# Patient Record
Sex: Female | Born: 1960 | Race: White | Hispanic: No | Smoking: Former smoker
Health system: Southern US, Community
[De-identification: ages and names within clinical notes are randomized; demographics above are authoritative.]

## PROBLEM LIST (undated history)

## (undated) DIAGNOSIS — F419 Anxiety disorder, unspecified: Secondary | ICD-10-CM

## (undated) DIAGNOSIS — D649 Anemia, unspecified: Secondary | ICD-10-CM

## (undated) DIAGNOSIS — I4891 Unspecified atrial fibrillation: Secondary | ICD-10-CM

## (undated) DIAGNOSIS — K429 Umbilical hernia without obstruction or gangrene: Secondary | ICD-10-CM

## (undated) DIAGNOSIS — F32A Depression, unspecified: Secondary | ICD-10-CM

## (undated) DIAGNOSIS — M549 Dorsalgia, unspecified: Secondary | ICD-10-CM

## (undated) DIAGNOSIS — R569 Unspecified convulsions: Secondary | ICD-10-CM

## (undated) DIAGNOSIS — J45909 Unspecified asthma, uncomplicated: Secondary | ICD-10-CM

## (undated) DIAGNOSIS — I509 Heart failure, unspecified: Secondary | ICD-10-CM

## (undated) DIAGNOSIS — F329 Major depressive disorder, single episode, unspecified: Secondary | ICD-10-CM

## (undated) DIAGNOSIS — E785 Hyperlipidemia, unspecified: Secondary | ICD-10-CM

## (undated) DIAGNOSIS — J101 Influenza due to other identified influenza virus with other respiratory manifestations: Secondary | ICD-10-CM

## (undated) DIAGNOSIS — I10 Essential (primary) hypertension: Secondary | ICD-10-CM

## (undated) DIAGNOSIS — F4310 Post-traumatic stress disorder, unspecified: Secondary | ICD-10-CM

## (undated) HISTORY — PX: BILATERAL CARPAL TUNNEL RELEASE: SHX6508

## (undated) HISTORY — DX: Unspecified asthma, uncomplicated: J45.909

## (undated) HISTORY — DX: Post-traumatic stress disorder, unspecified: F43.10

## (undated) HISTORY — DX: Anemia, unspecified: D64.9

## (undated) HISTORY — DX: Umbilical hernia without obstruction or gangrene: K42.9

## (undated) HISTORY — DX: Unspecified convulsions: R56.9

## (undated) HISTORY — DX: Anxiety disorder, unspecified: F41.9

## (undated) HISTORY — DX: Essential (primary) hypertension: I10

## (undated) HISTORY — DX: Unspecified atrial fibrillation: I48.91

## (undated) HISTORY — DX: Major depressive disorder, single episode, unspecified: F32.9

## (undated) HISTORY — PX: TUBAL LIGATION: SHX77

## (undated) HISTORY — DX: Depression, unspecified: F32.A

## (undated) HISTORY — DX: Heart failure, unspecified: I50.9

## (undated) HISTORY — PX: HERNIA REPAIR: SHX51

## (undated) HISTORY — DX: Influenza due to other identified influenza virus with other respiratory manifestations: J10.1

## (undated) HISTORY — DX: Dorsalgia, unspecified: M54.9

## (undated) HISTORY — PX: TONSILLECTOMY: SUR1361

## (undated) HISTORY — DX: Hyperlipidemia, unspecified: E78.5

---

## 2015-10-15 ENCOUNTER — Ambulatory Visit (INDEPENDENT_AMBULATORY_CARE_PROVIDER_SITE_OTHER): Payer: Medicaid Other | Admitting: Family Medicine

## 2015-10-15 ENCOUNTER — Encounter: Payer: Self-pay | Admitting: Family Medicine

## 2015-10-15 VITALS — BP 138/85 | HR 92 | Temp 98.9°F | Ht 62.0 in | Wt 164.6 lb

## 2015-10-15 DIAGNOSIS — M1712 Unilateral primary osteoarthritis, left knee: Secondary | ICD-10-CM

## 2015-10-15 DIAGNOSIS — K219 Gastro-esophageal reflux disease without esophagitis: Secondary | ICD-10-CM

## 2015-10-15 DIAGNOSIS — E785 Hyperlipidemia, unspecified: Secondary | ICD-10-CM | POA: Diagnosis not present

## 2015-10-15 DIAGNOSIS — I10 Essential (primary) hypertension: Secondary | ICD-10-CM

## 2015-10-15 DIAGNOSIS — J439 Emphysema, unspecified: Secondary | ICD-10-CM | POA: Diagnosis not present

## 2015-10-15 DIAGNOSIS — I4891 Unspecified atrial fibrillation: Secondary | ICD-10-CM | POA: Diagnosis not present

## 2015-10-15 DIAGNOSIS — M179 Osteoarthritis of knee, unspecified: Secondary | ICD-10-CM

## 2015-10-15 DIAGNOSIS — J449 Chronic obstructive pulmonary disease, unspecified: Secondary | ICD-10-CM | POA: Insufficient documentation

## 2015-10-15 DIAGNOSIS — R569 Unspecified convulsions: Secondary | ICD-10-CM | POA: Diagnosis not present

## 2015-10-15 DIAGNOSIS — M1711 Unilateral primary osteoarthritis, right knee: Secondary | ICD-10-CM

## 2015-10-15 DIAGNOSIS — I509 Heart failure, unspecified: Secondary | ICD-10-CM

## 2015-10-15 DIAGNOSIS — J189 Pneumonia, unspecified organism: Secondary | ICD-10-CM

## 2015-10-15 DIAGNOSIS — Y95 Nosocomial condition: Principal | ICD-10-CM

## 2015-10-15 MED ORDER — TIOTROPIUM BROMIDE MONOHYDRATE 18 MCG IN CAPS: 18.0000 ug | ORAL_CAPSULE | Freq: Every day | RESPIRATORY_TRACT | Status: DC

## 2015-10-15 MED ORDER — ALBUTEROL SULFATE HFA 108 (90 BASE) MCG/ACT IN AERS: 2.0000 | INHALATION_SPRAY | RESPIRATORY_TRACT | Status: DC | PRN

## 2015-10-15 MED ORDER — OMEPRAZOLE 20 MG PO CPDR: 20.0000 mg | DELAYED_RELEASE_CAPSULE | Freq: Every day | ORAL | Status: DC

## 2015-10-15 MED ORDER — BUDESONIDE-FORMOTEROL FUMARATE 160-4.5 MCG/ACT IN AERO: 2.0000 | INHALATION_SPRAY | Freq: Two times a day (BID) | RESPIRATORY_TRACT | Status: DC

## 2015-10-15 NOTE — Progress Notes (Signed)
BP 138/85 mmHg  Pulse 92  Temp(Src) 98.9 F (37.2 C) (Oral)  Ht  (1.575 m)  Wt 164 lb 9.6 oz (74.662 kg)  BMI 30.10 kg/m2   Subjective:    Patient ID: Latoya Bowman, female    DOB: 06-05-1960, 55 y.o.   MRN: [id number]  HPI: Latoya Bowman is a 55 y.o. female presenting on 10/15/2015 for Establish Care   HPI COPD and pneumonia/hospital follow-up Patient has known COPD and has been in and out of the hospital through 4 times with pneumonia over the past 3 months. She is coming in today as a new patient to establish care with [place]. She also has been diagnosed with congestive heart failure which may been component why she was in and out of the hospital with shortness of breath and cough and wheezing and fevers and chills. Today she is feeling a lot better than she was. She denies any shortness of breath but does have a minimal wheezing. She was on inhalers previous 2 hospitalizations but has not been continued on the inhalers currently. She was on Symbicort and albuterol and Spiriva previously.  Bilateral knee pain Patient has known arthritis in both of her knees and is overweight which stresses that his knees and this is been increasing more recently. She would like a referral to see an orthopedic to talk about options that she continue with her knees. She feels like they're starting to get about more than they were previously.  Hyperlipidemia Patient has known hyperlipidemia and is currently on lovastatin. She denies any issues with this.   A. fib Patient has known A. fib and is taking Cardizem to control the rate.Patient has been taking aspirin but is not on anything else for anticoagulation currently.  Seizure disorder history Patient is currently on Keppra. She has not had any seizures for some time. She is going to continue Keppra for now but would like to see a neurologist to reevaluate in the future as she has only had the seizures since she has had the pneumonia is in the  hospitalizations and illnesses. She is wondering if they will go away once she is healthier.  Hypertension Patient's blood pressure today is 138/85. She is currently on metoprolol and Cardizem. She denies any issues with these medications. Her blood pressures been stable on these medications.  GERD Patient has omeprazole for GERD consistent controlling it well. She denies any blood in her stool.  Relevant past medical, surgical, family and social history reviewed and updated as indicated. Interim medical history since our last visit reviewed. Allergies and medications reviewed and updated.  Review of Systems  Constitutional: Negative for fever and chills.  HENT: Positive for congestion. Negative for ear discharge, ear pain, postnasal drip, rhinorrhea, sinus pressure, sneezing and sore throat.   Eyes: Negative for redness and visual disturbance.  Respiratory: Positive for cough and wheezing. Negative for chest tightness and shortness of breath.   Cardiovascular: Negative for chest pain and leg swelling.  Gastrointestinal: Negative for abdominal pain, constipation, blood in stool, abdominal distention and anal bleeding.  Genitourinary: Negative for dysuria and difficulty urinating.  Musculoskeletal: Negative for back pain and gait problem.  Skin: Negative for rash.  Neurological: Negative for dizziness, light-headedness and headaches.  Psychiatric/Behavioral: Negative for behavioral problems and agitation.  All other systems reviewed and are negative.   Per HPI unless specifically indicated above  Social History   Social History  . Marital Status: Unknown    Spouse Name: N/A  .  Number of Children: N/A  . Years of Education: N/A   Occupational History  . Not on file.   Social History Main Topics  . Smoking status: Former Smoker -- 1.00 packs/day for 40 years    Start date: 05/08/1974    Quit date: 08/07/2015  . Smokeless tobacco: Never Used  . Alcohol Use: No     Comment:  rare  . Drug Use: No  . Sexual Activity: Yes   Other Topics Concern  . Not on file   Social History Narrative  . No narrative on file    Past Surgical History  Procedure Laterality Date  . Hernia repair      umbilical  . Tonsillectomy    . Tubal ligation    . Bilateral carpal tunnel release      Family History  Problem Relation Age of Onset  . Diabetes Mother   . Cancer Mother     brain  . COPD Mother   . Peripheral Artery Disease Mother   . Heart disease Father   . COPD Sister   . Diabetes Sister   . Hyperlipidemia Sister   . Hypertension Sister   . COPD Brother   . Diabetes Brother   . Depression Brother   . Hypertension Brother       Medication List       This list is accurate as of: 10/15/15  3:08 PM.  Always use your most recent med list.               albuterol 108 (90 Base) MCG/ACT inhaler  Commonly known as:  PROVENTIL HFA;VENTOLIN HFA  Inhale 2 puffs into the lungs every 4 (four) hours as needed for wheezing or shortness of breath.     baclofen 10 MG tablet  Commonly known as:  LIORESAL  Take 10 mg by mouth 3 (three) times daily.     budesonide-formoterol 160-4.5 MCG/ACT inhaler  Commonly known as:  SYMBICORT  Inhale 2 puffs into the lungs 2 (two) times daily.     diltiazem 60 MG tablet  Commonly known as:  CARDIZEM  Take 60 mg by mouth 4 (four) times daily.     doxycycline 100 MG capsule  Commonly known as:  VIBRAMYCIN  Take 100 mg by mouth 2 (two) times daily.     ferrous sulfate 325 (65 FE) MG tablet  Take 325 mg by mouth 2 (two) times daily with a meal.     FLUoxetine 40 MG capsule  Commonly known as:  PROZAC  Take 40 mg by mouth daily.     folic acid 1 MG tablet  Commonly known as:  FOLVITE  Take 1 mg by mouth daily.     guaifenesin 400 MG Tabs tablet  Commonly known as:  HUMIBID E  Take 400 mg by mouth every 6 (six) hours as needed.     ipratropium-albuterol 0.5-2.5 (3) MG/3ML Soln  Commonly known as:  DUONEB  Take 3  mLs by nebulization every 6 (six) hours as needed.     KEPPRA 750 MG tablet  Generic drug:  levETIRAcetam  Take 750 mg by mouth 2 (two) times daily.     KLONOPIN 0.5 MG tablet  Generic drug:  clonazePAM  Take 0.125 mg by mouth 3 (three) times daily as needed for anxiety.     LACTOBACILLUS BIFIDUS PO  Take 1 capsule by mouth 3 (three) times daily.     lovastatin 20 MG tablet  Commonly known as:  MEVACOR  Take 20 mg by mouth at bedtime.     metoprolol tartrate 25 MG tablet  Commonly known as:  LOPRESSOR  Take 12.5 mg by mouth 2 (two) times daily.     nystatin 100000 UNIT/ML suspension  Commonly known as:  MYCOSTATIN  Take 5 mLs by mouth 4 (four) times daily.     omeprazole 20 MG capsule  Commonly known as:  PRILOSEC  Take 1 capsule (20 mg total) by mouth daily.     OxyCODONE HCl (Abuse Deter) 5 MG Taba  Commonly known as:  OXAYDO  Take 5 mg by mouth 3 (three) times daily as needed.     POTASSIUM & SODIUM PHOSPHATES PO  Take 1 Package by mouth 2 (two) times daily before a meal.     thiamine 100 MG tablet  Take 100 mg by mouth daily.     tiotropium 18 MCG inhalation capsule  Commonly known as:  SPIRIVA  Place 1 capsule (18 mcg total) into inhaler and inhale daily.     Vitamin B-12 1000 MCG/15ML Liqd  Take 1,000 mcg by mouth every 30 (thirty) days.           Objective:    BP 138/85 mmHg  Pulse 92  Temp(Src) 98.9 F (37.2 C) (Oral)  Ht 5\' 2"  (1.575 m)  Wt 164 lb 9.6 oz (74.662 kg)  BMI 30.10 kg/m2  Wt Readings from Last 3 Encounters:  10/15/15 164 lb 9.6 oz (74.662 kg)    Physical Exam  Constitutional: She is oriented to person, place, and time. She appears well-developed and well-nourished. No distress.  HENT:  Right Ear: External ear normal.  Left Ear: External ear normal.  Nose: Nose normal.  Mouth/Throat: Oropharynx is clear and moist.  Eyes: Conjunctivae and EOM are normal. Pupils are equal, round, and reactive to light.  Neck: Neck supple. No  thyromegaly present.  Cardiovascular: Normal rate, regular rhythm, normal heart sounds and intact distal pulses.   No murmur heard. Pulmonary/Chest: Effort normal. No respiratory distress. She has wheezes (Mild upper lobe bilateral wheezes). She has rales (Trace left lower lobe).  Abdominal: Soft. Bowel sounds are normal. She exhibits no distension. There is no tenderness. There is no rebound.  Musculoskeletal: Normal range of motion. She exhibits no edema or tenderness.  Lymphadenopathy:    She has no cervical adenopathy.  Neurological: She is alert and oriented to person, place, and time. Coordination normal.  Skin: Skin is warm and dry. No rash noted. She is not diaphoretic.  Psychiatric: She has a normal mood and affect. Her behavior is normal.  Nursing note and vitals reviewed.   No results found for this or any previous visit.    Assessment & Plan:   Problem List Items Addressed This Visit      Cardiovascular and Mediastinum   CHF (congestive heart failure) (HCC)   Relevant Medications   lovastatin (MEVACOR) 20 MG tablet   metoprolol tartrate (LOPRESSOR) 25 MG tablet   diltiazem (CARDIZEM) 60 MG tablet   A-fib (HCC)   Relevant Medications   lovastatin (MEVACOR) 20 MG tablet   metoprolol tartrate (LOPRESSOR) 25 MG tablet   diltiazem (CARDIZEM) 60 MG tablet   Essential hypertension, benign   Relevant Medications   lovastatin (MEVACOR) 20 MG tablet   metoprolol tartrate (LOPRESSOR) 25 MG tablet   diltiazem (CARDIZEM) 60 MG tablet     Respiratory   COPD (chronic obstructive pulmonary disease) (HCC)   Relevant Medications   ipratropium-albuterol (DUONEB) 0.5-2.5 (3) MG/3ML  SOLN   guaifenesin (HUMIBID E) 400 MG TABS tablet   tiotropium (SPIRIVA) 18 MCG inhalation capsule   budesonide-formoterol (SYMBICORT) 160-4.5 MCG/ACT inhaler   albuterol (PROVENTIL HFA;VENTOLIN HFA) 108 (90 Base) MCG/ACT inhaler     Musculoskeletal and Integument   Osteoarthritis of left knee    Relevant Medications   OxyCODONE HCl, Abuse Deter, (OXAYDO) 5 MG TABA   baclofen (LIORESAL) 10 MG tablet   Other Relevant Orders   Ambulatory referral to Orthopedic Surgery   Ambulatory referral to Pain Clinic   Osteoarthritis of right knee   Relevant Medications   OxyCODONE HCl, Abuse Deter, (OXAYDO) 5 MG TABA   baclofen (LIORESAL) 10 MG tablet   Other Relevant Orders   Ambulatory referral to Orthopedic Surgery   Ambulatory referral to Pain Clinic     Other   Hyperlipidemia LDL goal <130   Relevant Medications   lovastatin (MEVACOR) 20 MG tablet   metoprolol tartrate (LOPRESSOR) 25 MG tablet   diltiazem (CARDIZEM) 60 MG tablet   Seizure (HCC)   Relevant Medications   levETIRAcetam (KEPPRA) 750 MG tablet   clonazePAM (KLONOPIN) 0.5 MG tablet    Other Visit Diagnoses    HAP (hospital-acquired pneumonia)    -  Primary    Relevant Medications    nystatin (MYCOSTATIN) 100000 UNIT/ML suspension    doxycycline (VIBRAMYCIN) 100 MG capsule    ipratropium-albuterol (DUONEB) 0.5-2.5 (3) MG/3ML SOLN    guaifenesin (HUMIBID E) 400 MG TABS tablet    tiotropium (SPIRIVA) 18 MCG inhalation capsule    budesonide-formoterol (SYMBICORT) 160-4.5 MCG/ACT inhaler    albuterol (PROVENTIL HFA;VENTOLIN HFA) 108 (90 Base) MCG/ACT inhaler    Gastroesophageal reflux disease without esophagitis        Relevant Medications    LACTOBACILLUS BIFIDUS PO    omeprazole (PRILOSEC) 20 MG capsule        Follow up plan: Return in about 2 weeks (around 10/29/2015), or if symptoms worsen or fail to improve, for COPD f.u.  Arville Care, MD [facility] 10/15/2015, 3:08 PM

## 2015-10-20 ENCOUNTER — Telehealth: Payer: Self-pay | Admitting: Family Medicine

## 2015-10-26 ENCOUNTER — Telehealth: Payer: Self-pay

## 2015-10-26 NOTE — Telephone Encounter (Signed)
Medicaid non preferred Symbicort  Preferred are Atrovent HFA inhaler, Combivent Respimat , ipratropium nebulizer solution, ipratropium-albuterol solution, Spiriva handihaler  Dr Louanne Skyeettinger

## 2015-10-26 NOTE — Telephone Encounter (Signed)
Covering for PCP while he is away for the week.    Will attempt prior auth, has Dx of COPD so it should be covered.   Murtis SinkSam Tajon Moring, MD [facility] 10/26/2015, 12:01 PM

## 2015-10-26 NOTE — Telephone Encounter (Signed)
Insurance authorized Symbicort x 1 year  [id number]

## 2015-11-02 ENCOUNTER — Encounter: Payer: Self-pay | Admitting: Family Medicine

## 2015-11-02 ENCOUNTER — Ambulatory Visit (INDEPENDENT_AMBULATORY_CARE_PROVIDER_SITE_OTHER): Payer: Medicaid Other | Admitting: Family Medicine

## 2015-11-02 VITALS — BP 173/106 | HR 108 | Temp 97.8°F | Ht 62.0 in | Wt 161.4 lb

## 2015-11-02 DIAGNOSIS — J439 Emphysema, unspecified: Secondary | ICD-10-CM | POA: Diagnosis not present

## 2015-11-02 MED ORDER — FLUTICASONE FUROATE-VILANTEROL 100-25 MCG/INH IN AEPB: 1.0000 | INHALATION_SPRAY | Freq: Every day | RESPIRATORY_TRACT | Status: DC

## 2015-11-02 MED ORDER — MELOXICAM 15 MG PO TABS: 15.0000 mg | ORAL_TABLET | Freq: Every day | ORAL | Status: DC

## 2015-11-02 NOTE — Progress Notes (Signed)
BP 173/106 mmHg  Pulse 108  Temp(Src) 97.8 F (36.6 C) (Oral)  Ht 5\' 2"  (1.575 m)  Wt 161 lb 6.4 oz (73.211 kg)  BMI 29.51 kg/m2  SpO2 98%   Subjective:    Patient ID: Latoya Bowman, female    DOB: 28-Nov-1960, 55 y.o.   MRN: [id number]  HPI: Latoya Bowman is a 55 y.o. female presenting on 11/02/2015 for Congestive Heart Failure; Arthritis; and COPD   HPI Shortness of breath and wheezing Patient comes in today for recheck on shortness of breath and wheezing. She did get her Spiriva and has an albuterol inhaler. She is using albuterol inhaler 2-3 times daily. She was supposed to get on Symbicort but it looks like the insurance didn't cover it and wanted us to try something else or put it through prior authorization. She is having daily wheezing and shortness of breath but she is improved from where she was previously. She denies any fevers or chills. She does have a cough that is mostly nonproductive.  Relevant past medical, surgical, family and social history reviewed and updated as indicated. Interim medical history since our last visit reviewed. Allergies and medications reviewed and updated.  Review of Systems  Constitutional: Negative for fever and chills.  HENT: Negative for congestion, ear discharge and ear pain.   Eyes: Negative for redness and visual disturbance.  Respiratory: Positive for cough, shortness of breath and wheezing. Negative for chest tightness.   Cardiovascular: Negative for chest pain and leg swelling.  Genitourinary: Negative for dysuria and difficulty urinating.  Musculoskeletal: Negative for back pain and gait problem.  Skin: Negative for rash.  Neurological: Negative for light-headedness and headaches.  Psychiatric/Behavioral: Negative for behavioral problems and agitation.  All other systems reviewed and are negative.   Per HPI unless specifically indicated above     Medication List       This list is accurate as of: 11/02/15  3:26 PM.  Always  use your most recent med list.               albuterol 108 (90 Base) MCG/ACT inhaler  Commonly known as:  PROVENTIL HFA;VENTOLIN HFA  Inhale 2 puffs into the lungs every 4 (four) hours as needed for wheezing or shortness of breath.     baclofen 10 MG tablet  Commonly known as:  LIORESAL  Take 10 mg by mouth 3 (three) times daily. Reported on 11/02/2015     budesonide-formoterol 160-4.5 MCG/ACT inhaler  Commonly known as:  SYMBICORT  Inhale 2 puffs into the lungs 2 (two) times daily.     diltiazem 60 MG tablet  Commonly known as:  CARDIZEM  Take 60 mg by mouth 4 (four) times daily.     doxycycline 100 MG capsule  Commonly known as:  VIBRAMYCIN  Take 100 mg by mouth 2 (two) times daily. Reported on 11/02/2015     ferrous sulfate 325 (65 FE) MG tablet  Take 325 mg by mouth 2 (two) times daily with a meal.     FLUoxetine 40 MG capsule  Commonly known as:  PROZAC  Take 40 mg by mouth daily.     fluticasone furoate-vilanterol 100-25 MCG/INH Aepb  Commonly known as:  BREO ELLIPTA  Inhale 1 puff into the lungs daily.     folic acid 1 MG tablet  Commonly known as:  FOLVITE  Take 1 mg by mouth daily.     guaifenesin 400 MG Tabs tablet  Commonly known as:  HUMIBID E  Take 400 mg by mouth every 6 (six) hours as needed. Reported on 11/02/2015     ipratropium-albuterol 0.5-2.5 (3) MG/3ML Soln  Commonly known as:  DUONEB  Take 3 mLs by nebulization every 6 (six) hours as needed.     KEPPRA 750 MG tablet  Generic drug:  levETIRAcetam  Take 750 mg by mouth 2 (two) times daily.     KLONOPIN 0.5 MG tablet  Generic drug:  clonazePAM  Take 0.125 mg by mouth 3 (three) times daily as needed for anxiety. Reported on 11/02/2015     LACTOBACILLUS BIFIDUS PO  Take 1 capsule by mouth 3 (three) times daily. Reported on 11/02/2015     lovastatin 20 MG tablet  Commonly known as:  MEVACOR  Take 20 mg by mouth at bedtime.     meloxicam 15 MG tablet  Commonly known as:  MOBIC  Take 1  tablet (15 mg total) by mouth daily.     metoprolol tartrate 25 MG tablet  Commonly known as:  LOPRESSOR  Take 12.5 mg by mouth 2 (two) times daily.     nystatin 100000 UNIT/ML suspension  Commonly known as:  MYCOSTATIN  Take 5 mLs by mouth 4 (four) times daily. Reported on 11/02/2015     omeprazole 20 MG capsule  Commonly known as:  PRILOSEC  Take 1 capsule (20 mg total) by mouth daily.     OxyCODONE HCl (Abuse Deter) 5 MG Taba  Commonly known as:  OXAYDO  Take 5 mg by mouth 3 (three) times daily as needed. Reported on 11/02/2015     POTASSIUM & SODIUM PHOSPHATES PO  Take 1 Package by mouth 2 (two) times daily before a meal. Reported on 11/02/2015     thiamine 100 MG tablet  Take 100 mg by mouth daily.     tiotropium 18 MCG inhalation capsule  Commonly known as:  SPIRIVA  Place 1 capsule (18 mcg total) into inhaler and inhale daily.     Vitamin B-12 1000 MCG/15ML Liqd  Take 1,000 mcg by mouth every 30 (thirty) days.           Objective:    BP 173/106 mmHg  Pulse 108  Temp(Src) 97.8 F (36.6 C) (Oral)  Ht 5\' 2"  (1.575 m)  Wt 161 lb 6.4 oz (73.211 kg)  BMI 29.51 kg/m2  SpO2 98%  Wt Readings from Last 3 Encounters:  11/02/15 161 lb 6.4 oz (73.211 kg)  10/15/15 164 lb 9.6 oz (74.662 kg)    Physical Exam  Constitutional: She is oriented to person, place, and time. She appears well-developed and well-nourished. No distress.  Eyes: Conjunctivae and EOM are normal. Pupils are equal, round, and reactive to light.  Neck: Neck supple. No thyromegaly present.  Cardiovascular: Normal rate, regular rhythm, normal heart sounds and intact distal pulses.   No murmur heard. Pulmonary/Chest: Effort normal. No respiratory distress. She has wheezes. She has no rales.  Musculoskeletal: Normal range of motion. She exhibits no edema or tenderness.  Lymphadenopathy:    She has no cervical adenopathy.  Neurological: She is alert and oriented to person, place, and time. Coordination  normal.  Skin: Skin is warm and dry. No rash noted. She is not diaphoretic.  Psychiatric: She has a normal mood and affect. Her behavior is normal.  Nursing note and vitals reviewed.   No results found for this or any previous visit.    Assessment & Plan:   Problem List Items Addressed This Visit  Respiratory   COPD (chronic obstructive pulmonary disease) (HCC) - Primary   Relevant Medications   fluticasone furoate-vilanterol (BREO ELLIPTA) 100-25 MCG/INH AEPB   meloxicam (MOBIC) 15 MG tablet       Follow up plan: Return in about 2 weeks (around 11/16/2015), or if symptoms worsen or fail to improve, for Recheck breathing.  Counseling provided for all of the vaccine components No orders of the defined types were placed in this encounter.    Arville Care, MD [facility] 11/02/2015, 3:26 PM

## 2015-11-03 ENCOUNTER — Telehealth: Payer: Self-pay | Admitting: Family Medicine

## 2015-11-03 MED ORDER — LOVASTATIN 20 MG PO TABS: 20.0000 mg | ORAL_TABLET | Freq: Every day | ORAL | Status: DC

## 2015-11-03 MED ORDER — BACLOFEN 10 MG PO TABS: 10.0000 mg | ORAL_TABLET | Freq: Three times a day (TID) | ORAL | Status: DC

## 2015-11-03 NOTE — Telephone Encounter (Signed)
Spoke to pt

## 2015-11-03 NOTE — Telephone Encounter (Signed)
Pt states [facility] refused to see her and she doesn't know why. Can you check into this for me please.

## 2015-11-08 ENCOUNTER — Telehealth: Payer: Self-pay

## 2015-11-08 DIAGNOSIS — Y95 Nosocomial condition: Principal | ICD-10-CM

## 2015-11-08 DIAGNOSIS — J189 Pneumonia, unspecified organism: Secondary | ICD-10-CM

## 2015-11-08 DIAGNOSIS — J439 Emphysema, unspecified: Secondary | ICD-10-CM

## 2015-11-08 MED ORDER — BUDESONIDE-FORMOTEROL FUMARATE 160-4.5 MCG/ACT IN AERO: 2.0000 | INHALATION_SPRAY | Freq: Two times a day (BID) | RESPIRATORY_TRACT | Status: DC

## 2015-11-08 NOTE — Telephone Encounter (Signed)
Insurance denied Breo, Symbicort Prescription sent to pharmacy   

## 2015-11-08 NOTE — Telephone Encounter (Signed)
Patient aware.

## 2015-11-08 NOTE — Telephone Encounter (Signed)
[facility]  Medicaid will not allow Breo until Qvar is tried and I see no history of that   Dr Dettinger's patient

## 2015-11-16 ENCOUNTER — Telehealth: Payer: Self-pay | Admitting: Family Medicine

## 2015-11-17 ENCOUNTER — Ambulatory Visit: Payer: Medicaid Other | Admitting: Family Medicine

## 2015-11-25 ENCOUNTER — Encounter: Payer: Medicaid Other | Admitting: Nurse Practitioner

## 2015-11-25 ENCOUNTER — Ambulatory Visit (INDEPENDENT_AMBULATORY_CARE_PROVIDER_SITE_OTHER): Payer: Medicaid Other | Admitting: Nurse Practitioner

## 2015-11-25 VITALS — BP 137/85 | HR 78 | Temp 97.0°F | Ht 62.0 in | Wt 163.6 lb

## 2015-11-25 DIAGNOSIS — D519 Vitamin B12 deficiency anemia, unspecified: Secondary | ICD-10-CM

## 2015-11-25 DIAGNOSIS — J441 Chronic obstructive pulmonary disease with (acute) exacerbation: Secondary | ICD-10-CM | POA: Diagnosis not present

## 2015-11-25 MED ORDER — DILTIAZEM HCL 60 MG PO TABS: 60.0000 mg | ORAL_TABLET | Freq: Four times a day (QID) | ORAL | Status: DC

## 2015-11-25 MED ORDER — CYANOCOBALAMIN 1000 MCG/ML IJ SOLN
1000.0000 ug | INTRAMUSCULAR | Status: AC
  Administered 2015-11-25 – 2016-06-02 (×6): 1000 ug via INTRAMUSCULAR

## 2015-11-25 MED ORDER — METOPROLOL TARTRATE 25 MG PO TABS: 12.5000 mg | ORAL_TABLET | Freq: Two times a day (BID) | ORAL | Status: DC

## 2015-11-25 MED ORDER — AZITHROMYCIN 250 MG PO TABS: ORAL_TABLET | ORAL | Status: DC

## 2015-11-25 MED ORDER — PREDNISONE 20 MG PO TABS: ORAL_TABLET | ORAL | Status: DC

## 2015-11-25 NOTE — Patient Instructions (Signed)
Chronic Obstructive Pulmonary Disease Chronic obstructive pulmonary disease (COPD) is a common lung condition in which airflow from the lungs is limited. COPD is a general term that can be used to describe many different lung problems that limit airflow, including both chronic bronchitis and emphysema. If you have COPD, your lung function will probably never return to normal, but there are measures you can take to improve lung function and make yourself feel better. CAUSES   Smoking (common).  Exposure to secondhand smoke.  Genetic problems.  Chronic inflammatory lung diseases or recurrent infections. SYMPTOMS  Shortness of breath, especially with physical activity.  Deep, persistent (chronic) cough with a large amount of thick mucus.  Wheezing.  Rapid breaths (tachypnea).  Gray or bluish discoloration (cyanosis) of the skin, especially in your fingers, toes, or lips.  Fatigue.  Weight loss.  Frequent infections or episodes when breathing symptoms become much worse (exacerbations).  Chest tightness. DIAGNOSIS Your health care provider will take a medical history and perform a physical examination to diagnose COPD. Additional tests for COPD may include:  Lung (pulmonary) function tests.  Chest X-ray.  CT scan.  Blood tests. TREATMENT  Treatment for COPD may include:  Inhaler and nebulizer medicines. These help manage the symptoms of COPD and make your breathing more comfortable.  Supplemental oxygen. Supplemental oxygen is only helpful if you have a low oxygen level in your blood.  Exercise and physical activity. These are beneficial for nearly all people with COPD.  Lung surgery or transplant.  Nutrition therapy to gain weight, if you are underweight.  Pulmonary rehabilitation. This may involve working with a team of health care providers and specialists, such as respiratory, occupational, and physical therapists. HOME CARE INSTRUCTIONS  Take all medicines  (inhaled or pills) as directed by your health care provider.  Avoid over-the-counter medicines or cough syrups that dry up your airway (such as antihistamines) and slow down the elimination of secretions unless instructed otherwise by your health care provider.  If you are a smoker, the most important thing that you can do is stop smoking. Continuing to smoke will cause further lung damage and breathing trouble. Ask your health care provider for help with quitting smoking. He or she can direct you to community resources or hospitals that provide support.  Avoid exposure to irritants such as smoke, chemicals, and fumes that aggravate your breathing.  Use oxygen therapy and pulmonary rehabilitation if directed by your health care provider. If you require home oxygen therapy, ask your health care provider whether you should purchase a pulse oximeter to measure your oxygen level at home.  Avoid contact with individuals who have a contagious illness.  Avoid extreme temperature and humidity changes.  Eat healthy foods. Eating smaller, more frequent meals and resting before meals may help you maintain your strength.  Stay active, but balance activity with periods of rest. Exercise and physical activity will help you maintain your ability to do things you want to do.  Preventing infection and hospitalization is very important when you have COPD. Make sure to receive all the vaccines your health care provider recommends, especially the pneumococcal and influenza vaccines. Ask your health care provider whether you need a pneumonia vaccine.  Learn and use relaxation techniques to manage stress.  Learn and use controlled breathing techniques as directed by your health care provider. Controlled breathing techniques include:  Pursed lip breathing. Start by breathing in (inhaling) through your nose for 1 second. Then, purse your lips as if you were   going to whistle and breathe out (exhale) through the  pursed lips for 2 seconds.  Diaphragmatic breathing. Start by putting one hand on your abdomen just above your waist. Inhale slowly through your nose. The hand on your abdomen should move out. Then purse your lips and exhale slowly. You should be able to feel the hand on your abdomen moving in as you exhale.  Learn and use controlled coughing to clear mucus from your lungs. Controlled coughing is a series of short, progressive coughs. The steps of controlled coughing are: 1. Lean your head slightly forward. 2. Breathe in deeply using diaphragmatic breathing. 3. Try to hold your breath for 3 seconds. 4. Keep your mouth slightly open while coughing twice. 5. Spit any mucus out into a tissue. 6. Rest and repeat the steps once or twice as needed. SEEK MEDICAL CARE IF:  You are coughing up more mucus than usual.  There is a change in the color or thickness of your mucus.  Your breathing is more labored than usual.  Your breathing is faster than usual. SEEK IMMEDIATE MEDICAL CARE IF:  You have shortness of breath while you are resting.  You have shortness of breath that prevents you from:  Being able to talk.  Performing your usual physical activities.  You have chest pain lasting longer than 5 minutes.  Your skin color is more cyanotic than usual.  You measure low oxygen saturations for longer than 5 minutes with a pulse oximeter. MAKE SURE YOU:  Understand these instructions.  Will watch your condition.  Will get help right away if you are not doing well or get worse.   This information is not intended to replace advice given to you by your health care provider. Make sure you discuss any questions you have with your health care provider.   Document Released: 02/01/2005 Document Revised: 05/15/2014 Document Reviewed: 12/19/2012 Elsevier Interactive Patient Education 2016 [employer]  

## 2015-11-25 NOTE — Progress Notes (Signed)
   Subjective:    Patient ID: Latoya Bowman, female    DOB: December 06, 1960, 55 y.o.   MRN: [id number]  HPI Patient brought in today with c/o cough and congestion that started 3-4 day sago- very hoarse. History of COPD. Very SOB. Does smoke   Review of Systems  Constitutional: Negative for fever and chills.  HENT: Positive for congestion and voice change. Negative for ear pain, postnasal drip, sore throat and trouble swallowing.   Respiratory: Positive for cough and shortness of breath.   Cardiovascular: Negative.   Gastrointestinal: Negative.   Genitourinary: Negative.   Psychiatric/Behavioral: Negative.   All other systems reviewed and are negative.      Objective:   Physical Exam  Constitutional: She is oriented to person, place, and time. She appears well-developed and well-nourished. She appears distressed (mild).  HENT:  Right Ear: Hearing, tympanic membrane, external ear and ear canal normal.  Left Ear: Hearing, tympanic membrane, external ear and ear canal normal.  Nose: Mucosal edema and rhinorrhea present.  Mouth/Throat: Uvula is midline. Posterior oropharyngeal edema present.  Neck: Normal range of motion. Neck supple.  Cardiovascular: Normal rate, regular rhythm and normal heart sounds.   Pulmonary/Chest: She is in respiratory distress (mild- appears to have problems getting air in lungs.). She has wheezes (insp wheezes throughout.).  Abdominal: Soft. Bowel sounds are normal.  Neurological: She is alert and oriented to person, place, and time.  Skin: Skin is warm and dry.  Psychiatric: She has a normal mood and affect. Her behavior is normal. Judgment and thought content normal.     BP 137/85 mmHg  Pulse 78  Temp(Src) 97 F (36.1 C) (Oral)  Ht 5\' 2"  (1.575 m)  Wt 163 lb 9.6 oz (74.208 kg)  BMI 29.92 kg/m2  SpO2 98%      Assessment & Plan:   1. COPD exacerbation (HCC)    Meds ordered this encounter  Medications  . azithromycin (ZITHROMAX Z-PAK) 250 MG tablet     Sig: As directed    Dispense:  1 each    Refill:  0    Order Specific Question:  Supervising Provider    Answer:  VINCENT, CAROL L [[id number]]  . predniSONE (DELTASONE) 20 MG tablet    Sig: 2 po at sametime daily for 5 days    Dispense:  10 tablet    Refill:  0    Order Specific Question:  Supervising Provider    Answer:  VINCENT, CAROL L [[id number]]  . metoprolol tartrate (LOPRESSOR) 25 MG tablet    Sig: Take 0.5 tablets (12.5 mg total) by mouth 2 (two) times daily.    Dispense:  60 tablet    Refill:  1    Order Specific Question:  Supervising Provider    Answer:  VINCENT, CAROL L [[id number]]  . diltiazem (CARDIZEM) 60 MG tablet    Sig: Take 1 tablet (60 mg total) by mouth 4 (four) times daily.    Dispense:  120 tablet    Refill:  1    Order Specific Question:  Supervising Provider    Answer:  VINCENT, CAROL L [[id number]]   Albuterol inhaler as needed RTO prn Rest Do not over exert yourself  2. b12 anemia - b12 injection today    Latoya Daphine DeutscherMartin, FNP

## 2015-11-29 ENCOUNTER — Other Ambulatory Visit: Payer: Self-pay | Admitting: Family Medicine

## 2015-11-29 ENCOUNTER — Telehealth: Payer: Self-pay | Admitting: Nurse Practitioner

## 2015-11-29 NOTE — Telephone Encounter (Signed)
According to chart she is suppose to take meds 4X a day- just hold what I sent in to take next month

## 2015-11-29 NOTE — Telephone Encounter (Signed)
Pt given appt with Dr.Dettinger 7/26 at 7:55.

## 2015-12-01 ENCOUNTER — Ambulatory Visit (INDEPENDENT_AMBULATORY_CARE_PROVIDER_SITE_OTHER): Payer: Medicaid Other | Admitting: Family Medicine

## 2015-12-01 ENCOUNTER — Encounter: Payer: Self-pay | Admitting: Family Medicine

## 2015-12-01 VITALS — BP 125/73 | HR 61 | Temp 97.1°F | Ht 62.0 in | Wt 164.0 lb

## 2015-12-01 DIAGNOSIS — M1711 Unilateral primary osteoarthritis, right knee: Secondary | ICD-10-CM

## 2015-12-01 DIAGNOSIS — M179 Osteoarthritis of knee, unspecified: Secondary | ICD-10-CM | POA: Diagnosis not present

## 2015-12-01 DIAGNOSIS — J439 Emphysema, unspecified: Secondary | ICD-10-CM

## 2015-12-01 MED ORDER — OXYCODONE-ACETAMINOPHEN 5-325 MG PO TABS: 1.0000 | ORAL_TABLET | Freq: Three times a day (TID) | ORAL | 0 refills | Status: DC | PRN

## 2015-12-01 NOTE — Progress Notes (Signed)
BP 125/73 (BP Location: Right Arm, Patient Position: Sitting, Cuff Size: Large)   Pulse 61   Temp 97.1 F (36.2 C) (Oral)   Ht 5\' 2"  (1.575 m)   Wt 164 lb (74.4 kg)   BMI 30.00 kg/m    Subjective:    Patient ID: Latoya Bowman, female    DOB: 06-10-1960, 55 y.o.   MRN: [id number]  HPI: Latoya Bowman is a 55 y.o. female presenting on 12/01/2015 for Knee Pain (followup, waiting on orthopaedic appointment)   HPI COPD recheck Patient is coming in today for a COPD recheck. She says her breathing has been doing better but then she recently had an exacerbation and has been getting better from that over the past week. She was seen 5 days ago and was treated for a COPD exacerbation and given azithromycin. She has been using her albuterol 3-4 times a day over this past week but has been improving day to day. She has been continued on [employer] and Spiriva. She is having some shortness of breath and wheezing today. She did stop smoking but then she restarted again and is back up to smoking what she was before. She continues to want to quit smoking and will continue to try.  Right knee arthritis Patient still complains of right knee arthritis and she has not been able to get into see the orthopedic just yet. She is wondering if we can bridge her until she gets pain management with some pain medication to help her. She says her pain keeps her up at night and gets up to a 7 out of 10 but currently is 2 out of 10. She also has pain in her left knee but is worse in the right knee than left. The pain is in the anterior aspect of the knee and is worse with any movement or ambulation or weightbearing.  Relevant past medical, surgical, family and social history reviewed and updated as indicated. Interim medical history since our last visit reviewed. Allergies and medications reviewed and updated.  Review of Systems  Constitutional: Negative for chills and fever.  HENT: Positive for congestion, postnasal  drip, rhinorrhea, sinus pressure, sneezing and sore throat. Negative for ear discharge and ear pain.   Eyes: Negative for pain, redness and visual disturbance.  Respiratory: Positive for chest tightness, shortness of breath and wheezing.   Cardiovascular: Negative for chest pain and leg swelling.  Genitourinary: Negative for difficulty urinating and dysuria.  Musculoskeletal: Positive for arthralgias. Negative for back pain, gait problem and joint swelling.  Skin: Negative for rash.  Neurological: Negative for light-headedness and headaches.  Psychiatric/Behavioral: Negative for agitation and behavioral problems.  All other systems reviewed and are negative.   Per HPI unless specifically indicated above     Medication List       Accurate as of 12/01/15  8:43 AM. Always use your most recent med list.          albuterol 108 (90 Base) MCG/ACT inhaler Commonly known as:  PROVENTIL HFA;VENTOLIN HFA Inhale 2 puffs into the lungs every 4 (four) hours as needed for wheezing or shortness of breath.   azithromycin 250 MG tablet Commonly known as:  ZITHROMAX Z-PAK As directed   baclofen 10 MG tablet Commonly known as:  LIORESAL Take 1 tablet (10 mg total) by mouth 3 (three) times daily. Reported on 11/02/2015   budesonide-formoterol 160-4.5 MCG/ACT inhaler Commonly known as:  SYMBICORT Inhale 2 puffs into the lungs 2 (two) times daily.  diltiazem 60 MG tablet Commonly known as:  CARDIZEM Take 1 tablet (60 mg total) by mouth 4 (four) times daily.   ferrous sulfate 325 (65 FE) MG tablet Take 325 mg by mouth 2 (two) times daily with a meal.   FLUoxetine 40 MG capsule Commonly known as:  PROZAC Take 40 mg by mouth daily.   fluticasone furoate-vilanterol 100-25 MCG/INH Aepb Commonly known as:  BREO ELLIPTA Inhale 1 puff into the lungs daily.   folic acid 1 MG tablet Commonly known as:  FOLVITE Take 1 mg by mouth daily.   guaifenesin 400 MG Tabs tablet Commonly known as:   HUMIBID E Take 400 mg by mouth every 6 (six) hours as needed. Reported on 11/25/2015   ipratropium-albuterol 0.5-2.5 (3) MG/3ML Soln Commonly known as:  DUONEB Take 3 mLs by nebulization every 6 (six) hours as needed.   KEPPRA 750 MG tablet Generic drug:  levETIRAcetam Take 750 mg by mouth 2 (two) times daily.   LACTOBACILLUS BIFIDUS PO Take 1 capsule by mouth 3 (three) times daily. Reported on 11/02/2015   lovastatin 20 MG tablet Commonly known as:  MEVACOR Take 1 tablet (20 mg total) by mouth at bedtime.   meloxicam 15 MG tablet Commonly known as:  MOBIC Take 1 tablet (15 mg total) by mouth daily.   metoprolol tartrate 25 MG tablet Commonly known as:  LOPRESSOR Take 0.5 tablets (12.5 mg total) by mouth 2 (two) times daily.   nystatin 100000 UNIT/ML suspension Commonly known as:  MYCOSTATIN Take 5 mLs by mouth 4 (four) times daily. Reported on 11/25/2015   omeprazole 20 MG capsule Commonly known as:  PRILOSEC Take 1 capsule (20 mg total) by mouth daily.   OxyCODONE HCl (Abuse Deter) 5 MG Taba Commonly known as:  OXAYDO Take 5 mg by mouth 3 (three) times daily as needed. Reported on 11/25/2015   oxyCODONE-acetaminophen 5-325 MG tablet Commonly known as:  ROXICET Take 1 tablet by mouth every 8 (eight) hours as needed for severe pain.   POTASSIUM & SODIUM PHOSPHATES PO Take 1 Package by mouth 2 (two) times daily before a meal. Reported on 11/25/2015   predniSONE 20 MG tablet Commonly known as:  DELTASONE 2 po at sametime daily for 5 days   thiamine 100 MG tablet Take 100 mg by mouth daily.   tiotropium 18 MCG inhalation capsule Commonly known as:  SPIRIVA Place 1 capsule (18 mcg total) into inhaler and inhale daily.   Vitamin B-12 1000 MCG/15ML Liqd Take 1,000 mcg by mouth every 30 (thirty) days. Reported on 11/25/2015          Objective:    BP 125/73 (BP Location: Right Arm, Patient Position: Sitting, Cuff Size: Large)   Pulse 61   Temp 97.1 F (36.2 C)  (Oral)   Ht  (1.575 m)   Wt 164 lb (74.4 kg)   BMI 30.00 kg/m   Wt Readings from Last 3 Encounters:  12/01/15 164 lb (74.4 kg)  11/25/15 163 lb 9.6 oz (74.2 kg)  11/02/15 161 lb 6.4 oz (73.2 kg)    Physical Exam  Constitutional: She is oriented to person, place, and time. She appears well-developed and well-nourished. No distress.  HENT:  Right Ear: Tympanic membrane, external ear and ear canal normal.  Left Ear: Tympanic membrane, external ear and ear canal normal.  Nose: Mucosal edema and rhinorrhea present. No epistaxis. Right sinus exhibits no maxillary sinus tenderness and no frontal sinus tenderness. Left sinus exhibits no maxillary sinus  tenderness and no frontal sinus tenderness.  Mouth/Throat: Uvula is midline and mucous membranes are normal. Posterior oropharyngeal edema and posterior oropharyngeal erythema present. No oropharyngeal exudate or tonsillar abscesses.  Eyes: Conjunctivae and EOM are normal.  Cardiovascular: Normal rate, regular rhythm, normal heart sounds and intact distal pulses.   No murmur heard. Pulmonary/Chest: Effort normal and breath sounds normal. No respiratory distress. She has no wheezes.  Musculoskeletal: Normal range of motion. She exhibits tenderness (Right and left knee anterior tenderness. Pain with any range of motion.). She exhibits no edema.  Neurological: She is alert and oriented to person, place, and time. Coordination normal.  Skin: Skin is warm and dry. No rash noted. She is not diaphoretic.  Psychiatric: She has a normal mood and affect. Her behavior is normal.  Vitals reviewed.  No results found for this or any previous visit.    Assessment & Plan:   Problem List Items Addressed This Visit      Respiratory   COPD (chronic obstructive pulmonary disease) (HCC) - Primary    Patient's breathing is improved, she is currently on an antibiotic and prednisone as she was seen less than a week ago by one of my fellow providers and was  started on these for a COPD exacerbation. She is currently using her albuterol 4 times daily. She is smoking again but intends to quit        Musculoskeletal and Integument   Osteoarthritis of right knee   Relevant Medications   oxyCODONE-acetaminophen (ROXICET) 5-325 MG tablet    Other Visit Diagnoses   None.     Follow up plan: Return in about 4 weeks (around 12/29/2015), or if symptoms worsen or fail to improve, for COPD recheck.  Counseling provided for all of the vaccine components No orders of the defined types were placed in this encounter.   Arville Care, MD [facility] 12/01/2015, 8:43 AM

## 2015-12-01 NOTE — Assessment & Plan Note (Signed)
Patient's breathing is improved, she is currently on an antibiotic and prednisone as she was seen less than a week ago by one of my fellow providers and was started on these for a COPD exacerbation. She is currently using her albuterol 4 times daily. She is smoking again but intends to quit

## 2015-12-17 ENCOUNTER — Other Ambulatory Visit: Payer: Self-pay | Admitting: Family Medicine

## 2015-12-17 NOTE — Telephone Encounter (Signed)
It is too soon for this refill, she just had a refilled on 12/01/2015, also we do not refill these outside of visit

## 2015-12-17 NOTE — Telephone Encounter (Signed)
Patient aware.

## 2015-12-23 ENCOUNTER — Encounter: Payer: Self-pay | Admitting: Family Medicine

## 2015-12-23 ENCOUNTER — Ambulatory Visit (INDEPENDENT_AMBULATORY_CARE_PROVIDER_SITE_OTHER): Payer: Medicaid Other | Admitting: Family Medicine

## 2015-12-23 DIAGNOSIS — M179 Osteoarthritis of knee, unspecified: Secondary | ICD-10-CM | POA: Diagnosis not present

## 2015-12-23 DIAGNOSIS — F329 Major depressive disorder, single episode, unspecified: Secondary | ICD-10-CM | POA: Insufficient documentation

## 2015-12-23 DIAGNOSIS — F449 Dissociative and conversion disorder, unspecified: Secondary | ICD-10-CM | POA: Diagnosis not present

## 2015-12-23 DIAGNOSIS — F418 Other specified anxiety disorders: Secondary | ICD-10-CM | POA: Diagnosis not present

## 2015-12-23 DIAGNOSIS — F419 Anxiety disorder, unspecified: Secondary | ICD-10-CM

## 2015-12-23 DIAGNOSIS — F444 Conversion disorder with motor symptom or deficit: Secondary | ICD-10-CM | POA: Insufficient documentation

## 2015-12-23 DIAGNOSIS — M1711 Unilateral primary osteoarthritis, right knee: Secondary | ICD-10-CM

## 2015-12-23 DIAGNOSIS — F32A Depression, unspecified: Secondary | ICD-10-CM

## 2015-12-23 MED ORDER — VENLAFAXINE HCL ER 37.5 MG PO CP24: 75.0000 mg | ORAL_CAPSULE | Freq: Every day | ORAL | 1 refills | Status: DC

## 2015-12-23 MED ORDER — OXYCODONE-ACETAMINOPHEN 5-325 MG PO TABS: 1.0000 | ORAL_TABLET | Freq: Three times a day (TID) | ORAL | 0 refills | Status: DC | PRN

## 2015-12-23 NOTE — Progress Notes (Signed)
BP 138/84 (BP Location: Right Arm, Patient Position: Sitting, Cuff Size: Large)   Pulse 91   Temp 99.4 F (37.4 C) (Oral)   Ht 5\' 2"  (1.575 m)   Wt 166 lb 3.2 oz (75.4 kg)   SpO2 98%   BMI 30.40 kg/m    Subjective:    Patient ID: Latoya Bowman, female    DOB: [birth year], 55 y.o.   MRN: [id number]  HPI: Latoya Ihaaomi Zumwalt is a 55 y.o. female presenting on 12/23/2015 for Hospital followup (was at [facility], records available in Care Everywhere)   HPI Hospital follow-up for shortness of breath and throat tightness Patient is coming in today for hospital follow-up. She was in [facility] about 2 weeks ago overnight. She and her the hospital for shortness of breath and feelings of tightness in her throat and thought she was having wheezing. At the hospital she was not having wheezing but stridor and they diagnosed her with functional dysphonia and an ENT did a nasal laryngoscopy and look at her vocal cords. He recommended follow-up with an ENT from that point. They gave her epinephrine and that made her feel a lot better. Today she is still having some of the stridor but much improved from when she was previously. She says she is stable at this point and knows that epinephrine is not something she can be continued on on a regular basis. She denies any wheezing. They said that likely she is having this because of how many times she was intubated and her vocal cords have not fully recovered from that.  Anxiety and depression Patient has had been having a lot of increased anxiety and feelings of depression and sadness and a lot of it's related to her health stuff that she's been going through. She has been on Prozac for quite some time and just feels like it is not working for her anymore. She would like to try something else to see if they could benefit for her. She denies any suicidal ideations or thoughts of hurting herself. She does sleep at night most the time except for when her breathing  is an issue. She does have panic attacks and anxiety attacks and thinks that that may be correlated with some of her stridor and breathing issues.  *Arthritis of the right knee. Patient has already finished the Percocet that we gave her earlier. She says that she was on much higher doses previously. 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 I instructed her that we gave her this is a when necessary and only use it on the really bad days. We'll give her a refill today but postdated so that it will come out at the beginning of the 30 day period when she is due to get the refills. The pain does limit her some but again with the concern of other risk factors that she has we are being very cautious about the dosing of this. Where also hoping that the antidepressant that she goes on will help some with pain.  Relevant past medical, surgical, family and social history reviewed and updated as indicated. Interim medical history since our last visit reviewed. Allergies and medications reviewed and updated.  Review of Systems  Constitutional: Negative for chills and fever.  HENT: Negative for congestion, ear discharge, ear pain,  postnasal drip, rhinorrhea, sinus pressure, sneezing and sore throat.   Eyes: Negative for redness and visual disturbance.  Respiratory: Positive for cough and stridor. Negative for chest tightness, shortness of breath and wheezing.   Cardiovascular: Negative for chest pain and leg swelling.  Genitourinary: Negative for difficulty urinating and dysuria.  Musculoskeletal: Positive for arthralgias. Negative for back pain and gait problem.  Skin: Negative for rash.  Neurological: Negative for light-headedness and headaches.  Psychiatric/Behavioral: Positive for decreased concentration, dysphoric mood and  sleep disturbance. Negative for agitation, behavioral problems, self-injury and suicidal ideas. The patient is nervous/anxious.   All other systems reviewed and are negative.   Per HPI unless specifically indicated above     Medication List       Accurate as of 12/23/15  5:11 PM. Always use your most recent med list.          albuterol 108 (90 Base) MCG/ACT inhaler Commonly known as:  PROVENTIL HFA;VENTOLIN HFA Inhale 2 puffs into the lungs every 4 (four) hours as needed for wheezing or shortness of breath.   baclofen 10 MG tablet Commonly known as:  LIORESAL Take 1 tablet (10 mg total) by mouth 3 (three) times daily. Reported on 11/02/2015   budesonide-formoterol 160-4.5 MCG/ACT inhaler Commonly known as:  SYMBICORT Inhale 2 puffs into the lungs 2 (two) times daily.   diltiazem 60 MG tablet Commonly known as:  CARDIZEM Take 1 tablet (60 mg total) by mouth 4 (four) times daily.   ferrous sulfate 325 (65 FE) MG tablet Take 325 mg by mouth 2 (two) times daily with a meal.   FLUoxetine 40 MG capsule Commonly known as:  PROZAC Take 40 mg by mouth daily.   fluticasone furoate-vilanterol 100-25 MCG/INH Aepb Commonly known as:  BREO ELLIPTA Inhale 1 puff into the lungs daily.   folic acid 1 MG tablet Commonly known as:  FOLVITE Take 1 mg by mouth daily.   ipratropium-albuterol 0.5-2.5 (3) MG/3ML Soln Commonly known as:  DUONEB Take 3 mLs by nebulization every 6 (six) hours as needed.   KEPPRA 750 MG tablet Generic drug:  levETIRAcetam Take 750 mg by mouth 2 (two) times daily.   LACTOBACILLUS BIFIDUS PO Take 1 capsule by mouth 3 (three) times daily. Reported on 11/02/2015   lovastatin 20 MG tablet Commonly known as:  MEVACOR Take 1 tablet (20 mg total) by mouth at bedtime.   meloxicam 15 MG tablet Commonly known as:  MOBIC Take 1 tablet (15 mg total) by mouth daily.   metoprolol tartrate 25 MG tablet Commonly known as:  LOPRESSOR Take 0.5 tablets (12.5 mg  total) by mouth 2 (two) times daily.   nystatin 100000 UNIT/ML suspension Commonly known as:  MYCOSTATIN Take 5 mLs by mouth 4 (four) times daily. Reported on 11/25/2015   omeprazole 20 MG capsule Commonly known as:  PRILOSEC Take 1 capsule (20 mg total) by mouth daily.   oxyCODONE-acetaminophen 5-325 MG tablet Commonly known as:  ROXICET Take 1 tablet by mouth every 8 (eight) hours as needed for severe pain. Do not fill 01/01/16   POTASSIUM & SODIUM PHOSPHATES PO Take 1 Package by mouth 2 (two) times daily before a meal. Reported on 11/25/2015   thiamine 100 MG tablet Take 100 mg by mouth daily.   tiotropium 18 MCG inhalation capsule Commonly known as:  SPIRIVA Place 1 capsule (18 mcg total) into inhaler and inhale daily.   venlafaxine XR 37.5 MG 24 hr capsule Commonly known as:  EFFEXOR XR Take  2 capsules (75 mg total) by mouth daily.   Vitamin B-12 1000 MCG/15ML Liqd Take 1,000 mcg by mouth every 30 (thirty) days. Reported on 11/25/2015          Objective:    BP 138/84 (BP Location: Right Arm, Patient Position: Sitting, Cuff Size: Large)   Pulse 91   Temp 99.4 F (37.4 C) (Oral)   Ht 5\' 2"  (1.575 m)   Wt 166 lb 3.2 oz (75.4 kg)   SpO2 98%   BMI 30.40 kg/m   Wt Readings from Last 3 Encounters:  12/23/15 166 lb 3.2 oz (75.4 kg)  12/01/15 164 lb (74.4 kg)  11/25/15 163 lb 9.6 oz (74.2 kg)    Physical Exam  Constitutional: She is oriented to person, place, and time. She appears well-developed and well-nourished. No distress.  HENT:  Right Ear: External ear normal.  Left Ear: External ear normal.  Nose: Nose normal.  Mouth/Throat: Oropharynx is clear and moist. No oropharyngeal exudate.  Eyes: Conjunctivae and EOM are normal. Pupils are equal, round, and reactive to light.  Neck: Neck supple. No thyromegaly present.  Cardiovascular: Normal rate, regular rhythm, normal heart sounds and intact distal pulses.   No murmur heard. Pulmonary/Chest: Effort normal and  breath sounds normal. No tachypnea and no bradypnea. No respiratory distress. She has no decreased breath sounds. She has no wheezes. She has no rhonchi. She has no rales.  Patient has stridor  Musculoskeletal: Normal range of motion. She exhibits no edema.       Right knee: She exhibits normal range of motion, no swelling, no effusion, no ecchymosis, no deformity, normal alignment, no LCL laxity, normal patellar mobility, normal meniscus and no MCL laxity. Tenderness found. Medial joint line and patellar tendon tenderness noted.  Lymphadenopathy:    She has no cervical adenopathy.  Neurological: She is alert and oriented to person, place, and time. Coordination normal.  Skin: Skin is warm and dry. No rash noted. She is not diaphoretic.  Psychiatric: Her behavior is normal. Judgment normal. Her mood appears anxious. She exhibits a depressed mood. She expresses no suicidal ideation. She expresses no suicidal plans.  Nursing note and vitals reviewed.   No results found for this or any previous visit.    Assessment & Plan:   Problem List Items Addressed This Visit      Musculoskeletal and Integument   Osteoarthritis of right knee   Relevant Medications   oxyCODONE-acetaminophen (ROXICET) 5-325 MG tablet     Other   Functional dysphonia   Relevant Orders   Ambulatory referral to ENT   Anxiety and depression   Relevant Medications   venlafaxine XR (EFFEXOR XR) 37.5 MG 24 hr capsule    Other Visit Diagnoses   None.      Follow up plan: Return in about 4 weeks (around 01/20/2016), or if symptoms worsen or fail to improve, for anxiety and depression.  Counseling provided for all of the vaccine components Orders Placed This Encounter  Procedures  . Ambulatory referral to ENT    Arville Care, MD [facility] 12/23/2015, 5:11 PM

## 2015-12-24 ENCOUNTER — Telehealth: Payer: Self-pay | Admitting: Family Medicine

## 2015-12-24 NOTE — Telephone Encounter (Signed)
Call handled

## 2015-12-27 ENCOUNTER — Ambulatory Visit (INDEPENDENT_AMBULATORY_CARE_PROVIDER_SITE_OTHER): Payer: Medicaid Other | Admitting: *Deleted

## 2015-12-27 DIAGNOSIS — D519 Vitamin B12 deficiency anemia, unspecified: Secondary | ICD-10-CM

## 2015-12-27 NOTE — Progress Notes (Signed)
Pt given B12 injection IM left deltoid and tolerated well. °

## 2015-12-28 ENCOUNTER — Ambulatory Visit: Payer: Medicaid Other | Admitting: Family Medicine

## 2015-12-30 ENCOUNTER — Other Ambulatory Visit: Payer: Self-pay | Admitting: Family Medicine

## 2015-12-30 NOTE — Telephone Encounter (Signed)
No labs in chart

## 2016-01-07 ENCOUNTER — Telehealth: Payer: Self-pay | Admitting: Family Medicine

## 2016-01-07 MED ORDER — DILTIAZEM HCL ER COATED BEADS 180 MG PO CP24: 180.0000 mg | ORAL_CAPSULE | Freq: Every day | ORAL | 1 refills | Status: DC

## 2016-01-07 NOTE — Telephone Encounter (Signed)
The regular short acting Cardizem is taking 4 times a day and 60 mg 4 times a day is a valid prescription. It looks like it was sent in by Gennette PacMary Margaret, the other option is to put the patient on Cardizem extended release 180 mg once daily. Either way is a valid dosing and I do not know why for sure the patient was kept on the 60 mg 4 times daily.

## 2016-01-07 NOTE — Telephone Encounter (Signed)
Pt is requesting the Cardizem 180mg  ER take 1 PO daily. Rx sent to pharmacy.

## 2016-01-21 ENCOUNTER — Ambulatory Visit (INDEPENDENT_AMBULATORY_CARE_PROVIDER_SITE_OTHER): Payer: Medicaid Other | Admitting: Family Medicine

## 2016-01-21 ENCOUNTER — Encounter: Payer: Self-pay | Admitting: Family Medicine

## 2016-01-21 VITALS — BP 128/85 | HR 90 | Temp 98.2°F | Ht 62.0 in | Wt 170.0 lb

## 2016-01-21 DIAGNOSIS — F329 Major depressive disorder, single episode, unspecified: Secondary | ICD-10-CM

## 2016-01-21 DIAGNOSIS — F418 Other specified anxiety disorders: Secondary | ICD-10-CM | POA: Diagnosis not present

## 2016-01-21 DIAGNOSIS — L03114 Cellulitis of left upper limb: Secondary | ICD-10-CM | POA: Diagnosis not present

## 2016-01-21 DIAGNOSIS — M179 Osteoarthritis of knee, unspecified: Secondary | ICD-10-CM

## 2016-01-21 DIAGNOSIS — H60392 Other infective otitis externa, left ear: Secondary | ICD-10-CM

## 2016-01-21 DIAGNOSIS — F419 Anxiety disorder, unspecified: Principal | ICD-10-CM

## 2016-01-21 DIAGNOSIS — M1711 Unilateral primary osteoarthritis, right knee: Secondary | ICD-10-CM

## 2016-01-21 MED ORDER — OXYCODONE-ACETAMINOPHEN 5-325 MG PO TABS: 1.0000 | ORAL_TABLET | Freq: Two times a day (BID) | ORAL | 0 refills | Status: DC | PRN

## 2016-01-21 MED ORDER — VENLAFAXINE HCL ER 150 MG PO CP24: 150.0000 mg | ORAL_CAPSULE | Freq: Every day | ORAL | 2 refills | Status: DC

## 2016-01-21 MED ORDER — DOXYCYCLINE HYCLATE 100 MG PO TABS: 100.0000 mg | ORAL_TABLET | Freq: Two times a day (BID) | ORAL | 0 refills | Status: DC

## 2016-01-21 NOTE — Progress Notes (Signed)
BP 128/85   Pulse 90   Temp 98.2 F (36.8 C) (Oral)   Ht 5\' 2"  (1.575 m)   Wt 170 lb (77.1 kg)   BMI 31.09 kg/m    Subjective:    Patient ID: Latoya Bowman, female    DOB: 1961/02/27, 55 y.o.   MRN: [id number]  HPI: Latoya Bowman is a 55 y.o. female presenting on 01/21/2016 for Depression (4 week followup); Anxiety; and Left ear pain (began about three weeks ago)   HPI Chronic knee pain from arthritis Patient is coming in for a recheck on her chronic knee pain. She needs a refill of her medications. She says the oxycodone has been helping sufficiently. The right knee is the knee that has been causing her most significant pain. She has tried injections previously and has seen orthopedics for it. She is trying to work with orthopedics to see if they can do surgery for it but because of her COPD issues and breathing they are hesitant to do it.  Anxiety and depression recheck Patient is currently on Effexor 75 mg daily and feels like it is working pretty well for her but not completely. She would like to try and go up on the dose. She denies any suicidal ideations or thoughts of hurting herself. She is starting to sleep better at night with this medication as well. She still has a lot of anxiety because of her medical issues but it has greatly improved.  Skin infections Patient has a skin infections developing in the outer ear and just inside her ear canal that is causing a lot of pain. She is also having swelling and dry cracking skin. She has had this previously but it is returning over the past week. She also has a cough on her left posterior forearm that is like a small pimple that is starting to get red and warm. It has not had any drainage or purulence out of it.  Relevant past medical, surgical, family and social history reviewed and updated as indicated. Interim medical history since our last visit reviewed. Allergies and medications reviewed and updated.  Review of Systems    Constitutional: Negative for chills and fever.  HENT: Negative for congestion, ear discharge and ear pain.   Eyes: Negative for redness and visual disturbance.  Respiratory: Negative for chest tightness and shortness of breath.   Cardiovascular: Negative for chest pain and leg swelling.  Genitourinary: Negative for difficulty urinating and dysuria.  Musculoskeletal: Positive for arthralgias. Negative for back pain, gait problem and joint swelling.  Skin: Positive for color change and rash.  Neurological: Negative for light-headedness and headaches.  Psychiatric/Behavioral: Positive for sleep disturbance. Negative for agitation, behavioral problems, decreased concentration, dysphoric mood, self-injury and suicidal ideas. The patient is nervous/anxious. The patient is not hyperactive.   All other systems reviewed and are negative.   Per HPI unless specifically indicated above     Medication List       Accurate as of 01/21/16  4:55 PM. Always use your most recent med list.          albuterol 108 (90 Base) MCG/ACT inhaler Commonly known as:  PROVENTIL HFA;VENTOLIN HFA Inhale 2 puffs into the lungs every 4 (four) hours as needed for wheezing or shortness of breath.   baclofen 10 MG tablet Commonly known as:  LIORESAL Take 1 tablet (10 mg total) by mouth 3 (three) times daily. Reported on 11/02/2015   budesonide-formoterol 160-4.5 MCG/ACT inhaler Commonly known as:  SYMBICORT Inhale  2 puffs into the lungs 2 (two) times daily.   diltiazem 180 MG 24 hr capsule Commonly known as:  CARDIZEM CD Take 1 capsule (180 mg total) by mouth daily.   doxycycline 100 MG tablet Commonly known as:  VIBRA-TABS Take 1 tablet (100 mg total) by mouth 2 (two) times daily. 1 po bid   ferrous sulfate 325 (65 FE) MG tablet Take 325 mg by mouth 2 (two) times daily with a meal.   fluticasone furoate-vilanterol 100-25 MCG/INH Aepb Commonly known as:  BREO ELLIPTA Inhale 1 puff into the lungs daily.    folic acid 1 MG tablet Commonly known as:  FOLVITE Take 1 mg by mouth daily.   ipratropium-albuterol 0.5-2.5 (3) MG/3ML Soln Commonly known as:  DUONEB Take 3 mLs by nebulization every 6 (six) hours as needed.   KEPPRA 750 MG tablet Generic drug:  levETIRAcetam Take 750 mg by mouth 2 (two) times daily.   lovastatin 20 MG tablet Commonly known as:  MEVACOR TAKE 1 TABLET (20 MG TOTAL) BY MOUTH AT BEDTIME.   meloxicam 15 MG tablet Commonly known as:  MOBIC Take 1 tablet (15 mg total) by mouth daily.   metoprolol tartrate 25 MG tablet Commonly known as:  LOPRESSOR Take 0.5 tablets (12.5 mg total) by mouth 2 (two) times daily.   omeprazole 20 MG capsule Commonly known as:  PRILOSEC Take 1 capsule (20 mg total) by mouth daily.   oxyCODONE-acetaminophen 5-325 MG tablet Commonly known as:  ROXICET Take 1 tablet by mouth 2 (two) times daily as needed for severe pain. Do not fill 01/01/16   oxyCODONE-acetaminophen 5-325 MG tablet Commonly known as:  ROXICET Take 1 tablet by mouth 2 (two) times daily as needed for severe pain. Do not refill until 30 days from prescription date   tiotropium 18 MCG inhalation capsule Commonly known as:  SPIRIVA Place 1 capsule (18 mcg total) into inhaler and inhale daily.   venlafaxine XR 150 MG 24 hr capsule Commonly known as:  EFFEXOR XR Take 1 capsule (150 mg total) by mouth daily with breakfast.   Vitamin B-12 1000 MCG/15ML Liqd Take 1,000 mcg by mouth every 30 (thirty) days. Reported on 11/25/2015          Objective:    BP 128/85   Pulse 90   Temp 98.2 F (36.8 C) (Oral)   Ht 5\' 2"  (1.575 m)   Wt 170 lb (77.1 kg)   BMI 31.09 kg/m   Wt Readings from Last 3 Encounters:  01/21/16 170 lb (77.1 kg)  12/23/15 166 lb 3.2 oz (75.4 kg)  12/01/15 164 lb (74.4 kg)    Physical Exam  Constitutional: She is oriented to person, place, and time. She appears well-developed and well-nourished. No distress.  Eyes: Conjunctivae are normal.   Cardiovascular: Normal rate, regular rhythm, normal heart sounds and intact distal pulses.   No murmur heard. Pulmonary/Chest: Effort normal and breath sounds normal. No respiratory distress. She has no wheezes.  Musculoskeletal: Normal range of motion. She exhibits no edema.       Right knee: She exhibits normal range of motion, no swelling, no laceration, normal alignment, no LCL laxity, normal patellar mobility, normal meniscus and no MCL laxity. Tenderness found. Medial joint line tenderness noted.  Neurological: She is alert and oriented to person, place, and time. Coordination normal.  Skin: Skin is warm and dry. Lesion (Small papule that is erythematous and warm, no purulence or induration. The lesion is on left posterior forearm) noted.  No rash noted. She is not diaphoretic. There is erythema (Patient has erythema and drainage and a little bit of purulence out of her left ear).  Psychiatric: Her behavior is normal. Her mood appears anxious. She exhibits a depressed mood.  Nursing note and vitals reviewed.  No results found for this or any previous visit.    Assessment & Plan:   Problem List Items Addressed This Visit      Musculoskeletal and Integument   Osteoarthritis of right knee   Relevant Medications   oxyCODONE-acetaminophen (ROXICET) 5-325 MG tablet   oxyCODONE-acetaminophen (ROXICET) 5-325 MG tablet     Other   Anxiety and depression - Primary   Relevant Medications   venlafaxine XR (EFFEXOR XR) 150 MG 24 hr capsule    Other Visit Diagnoses    Cellulitis of left upper extremity       Relevant Medications   doxycycline (VIBRA-TABS) 100 MG tablet   Otitis, externa, infective, left       Relevant Medications   doxycycline (VIBRA-TABS) 100 MG tablet       Follow up plan: Return in about 2 months (around 03/22/2016), or if symptoms worsen or fail to improve, for anxiety and depression recheck.  Counseling provided for all of the vaccine components No orders of  the defined types were placed in this encounter.   Arville Care, MD [facility] 01/21/2016, 4:55 PM

## 2016-01-24 ENCOUNTER — Ambulatory Visit (INDEPENDENT_AMBULATORY_CARE_PROVIDER_SITE_OTHER): Payer: Self-pay | Admitting: Otolaryngology

## 2016-01-27 ENCOUNTER — Other Ambulatory Visit: Payer: Self-pay | Admitting: Family Medicine

## 2016-01-27 ENCOUNTER — Ambulatory Visit (INDEPENDENT_AMBULATORY_CARE_PROVIDER_SITE_OTHER): Payer: Medicaid Other | Admitting: *Deleted

## 2016-01-27 DIAGNOSIS — D519 Vitamin B12 deficiency anemia, unspecified: Secondary | ICD-10-CM | POA: Diagnosis not present

## 2016-01-27 NOTE — Progress Notes (Signed)
Pt given vit B12 inj Pt tolerated well

## 2016-01-31 ENCOUNTER — Telehealth: Payer: Self-pay | Admitting: Family Medicine

## 2016-01-31 NOTE — Telephone Encounter (Signed)
Yes received  Will work on prior authorizations tommorow

## 2016-02-01 ENCOUNTER — Other Ambulatory Visit: Payer: Self-pay | Admitting: Family Medicine

## 2016-02-01 DIAGNOSIS — J439 Emphysema, unspecified: Secondary | ICD-10-CM

## 2016-02-02 ENCOUNTER — Other Ambulatory Visit: Payer: Self-pay

## 2016-02-02 DIAGNOSIS — J439 Emphysema, unspecified: Secondary | ICD-10-CM

## 2016-02-02 MED ORDER — ALBUTEROL SULFATE (2.5 MG/3ML) 0.083% IN NEBU: INHALATION_SOLUTION | RESPIRATORY_TRACT | 0 refills | Status: DC

## 2016-02-02 MED ORDER — FLUTICASONE FUROATE-VILANTEROL 100-25 MCG/INH IN AEPB: 1.0000 | INHALATION_SPRAY | Freq: Every day | RESPIRATORY_TRACT | 2 refills | Status: DC

## 2016-02-03 ENCOUNTER — Telehealth: Payer: Self-pay

## 2016-02-03 DIAGNOSIS — Y95 Nosocomial condition: Principal | ICD-10-CM

## 2016-02-03 DIAGNOSIS — J189 Pneumonia, unspecified organism: Secondary | ICD-10-CM

## 2016-02-03 DIAGNOSIS — J439 Emphysema, unspecified: Secondary | ICD-10-CM

## 2016-02-03 MED ORDER — BUDESONIDE-FORMOTEROL FUMARATE 160-4.5 MCG/ACT IN AERO: 2.0000 | INHALATION_SPRAY | Freq: Two times a day (BID) | RESPIRATORY_TRACT | 2 refills | Status: AC

## 2016-02-07 NOTE — Telephone Encounter (Signed)
x

## 2016-02-08 ENCOUNTER — Telehealth: Payer: Self-pay | Admitting: Family Medicine

## 2016-02-09 ENCOUNTER — Ambulatory Visit (INDEPENDENT_AMBULATORY_CARE_PROVIDER_SITE_OTHER): Payer: Medicaid Other | Admitting: Family Medicine

## 2016-02-09 ENCOUNTER — Encounter: Payer: Self-pay | Admitting: Family Medicine

## 2016-02-09 ENCOUNTER — Other Ambulatory Visit: Payer: Self-pay | Admitting: Family Medicine

## 2016-02-09 VITALS — BP 137/87 | HR 108 | Temp 100.3°F | Ht 62.0 in | Wt 168.5 lb

## 2016-02-09 DIAGNOSIS — G4700 Insomnia, unspecified: Secondary | ICD-10-CM | POA: Diagnosis not present

## 2016-02-09 DIAGNOSIS — R569 Unspecified convulsions: Secondary | ICD-10-CM | POA: Diagnosis not present

## 2016-02-09 MED ORDER — PROMETHAZINE HCL 25 MG PO TABS: 25.0000 mg | ORAL_TABLET | Freq: Four times a day (QID) | ORAL | 1 refills | Status: DC | PRN

## 2016-02-09 MED ORDER — MIRTAZAPINE 15 MG PO TABS: 15.0000 mg | ORAL_TABLET | Freq: Every day | ORAL | 1 refills | Status: AC

## 2016-02-09 NOTE — Progress Notes (Signed)
BP 137/87   Pulse (!) 108   Temp 100.3 F (37.9 C) (Oral)   Ht 5\' 2"  (1.575 m)   Wt 168 lb 8 oz (76.4 kg)   BMI 30.82 kg/m    Subjective:    Patient ID: Latoya Bowman, female    DOB: [birth year], 55 y.o.   MRN: [id number]  HPI: Latoya Bowman is a 55 y.o. female presenting on 02/09/2016 for Seizures (patient has had 2 - 3 seizures per day for the last week)   HPI Seizures Patient comes in today for seizures. She has been on Keppra but she says she is having 2-3 seizures a day and sometimes they come a few on top of each other. They're warned now if that happens again they probably need to go to the emergency department. We'll put in referral for neurologist here in the area. Her seizure medication was working but all of a sudden it is not really do not know why. She does not have any signs of infection going on currently. Her seizures when she does have some per husband are generalized tonic-clonic and she loses consciousness and it takes about 20-30 minutes to her she recovers fully. Her seizures are lasting anywhere from a few minutes to an hour when they are back to back on top of each other.  Insomnia Patient's breathing has been improving and her general health has been improving but now she is still having difficulty sleeping at night. She says that she'll sleep a couple hours and then wake up and have difficulty going back to sleep or have difficulty going back to sleep initially. She has tried trazodone and Ambien previously. We did not feel like she is a candidate for Ambien because of her breathing difficulties.  Relevant past medical, surgical, family and social history reviewed and updated as indicated. Interim medical history since our last visit reviewed. Allergies and medications reviewed and updated.  Review of Systems  Constitutional: Positive for fatigue. Negative for chills and fever.  HENT: Negative for congestion, ear discharge and ear pain.   Eyes: Negative for  redness and visual disturbance.  Respiratory: Negative for chest tightness and shortness of breath.   Cardiovascular: Negative for chest pain and leg swelling.  Genitourinary: Negative for difficulty urinating and dysuria.  Musculoskeletal: Negative for back pain and gait problem.  Skin: Negative for rash.  Neurological: Positive for seizures. Negative for dizziness, speech difficulty, weakness, light-headedness, numbness and headaches.  Psychiatric/Behavioral: Positive for sleep disturbance. Negative for agitation, behavioral problems, self-injury and suicidal ideas.  All other systems reviewed and are negative.   Per HPI unless specifically indicated above      Objective:    BP 137/87   Pulse (!) 108   Temp 100.3 F (37.9 C) (Oral)   Ht 5\' 2"  (1.575 m)   Wt 168 lb 8 oz (76.4 kg)   BMI 30.82 kg/m   Wt Readings from Last 3 Encounters:  02/09/16 168 lb 8 oz (76.4 kg)  01/21/16 170 lb (77.1 kg)  12/23/15 166 lb 3.2 oz (75.4 kg)    Physical Exam  Constitutional: She is oriented to person, place, and time. She appears well-developed and well-nourished. No distress.  Eyes: Conjunctivae are normal.  Neck: Neck supple. No thyromegaly present.  Cardiovascular: Normal rate, regular rhythm, normal heart sounds and intact distal pulses.   No murmur heard. Pulmonary/Chest: Effort normal and breath sounds normal. No respiratory distress. She has no wheezes. She has no rales.  Musculoskeletal: Normal  range of motion. She exhibits no edema or tenderness.  Lymphadenopathy:    She has no cervical adenopathy.  Neurological: She is alert and oriented to person, place, and time. She displays normal reflexes. No cranial nerve deficit. She exhibits normal muscle tone. Coordination normal.  Skin: Skin is warm and dry. No rash noted. She is not diaphoretic.  Psychiatric: She has a normal mood and affect. Her behavior is normal.  Nursing note and vitals reviewed.     Assessment & Plan:    Problem List Items Addressed This Visit      Other   Seizure [facility]) - Primary   Relevant Orders   Ambulatory referral to Neurology    Other Visit Diagnoses    Insomnia, unspecified type       Relevant Medications   mirtazapine (REMERON) 15 MG tablet       Follow up plan: Return if symptoms worsen or fail to improve.  Counseling provided for all of the vaccine components Orders Placed This Encounter  Procedures  . Ambulatory referral to Neurology    Arville Care, MD [facility] 02/09/2016, 2:58 PM

## 2016-02-10 NOTE — Telephone Encounter (Signed)
Mailed today

## 2016-02-11 ENCOUNTER — Telehealth: Payer: Self-pay | Admitting: Family Medicine

## 2016-02-11 MED ORDER — LIDOCAINE 5 % EX PTCH: 1.0000 | MEDICATED_PATCH | CUTANEOUS | 2 refills | Status: DC

## 2016-02-11 NOTE — Telephone Encounter (Signed)
Patient aware.

## 2016-02-15 ENCOUNTER — Telehealth: Payer: Self-pay

## 2016-02-16 NOTE — Telephone Encounter (Signed)
Please inform the patient that it looks like the Medicaid would not cover the lidocaine patches. We can try and run a prior off but I don't know if it's going to be covered.

## 2016-02-16 NOTE — Telephone Encounter (Signed)
Left message for patient to call.

## 2016-02-21 ENCOUNTER — Telehealth: Payer: Self-pay | Admitting: Family Medicine

## 2016-02-26 ENCOUNTER — Other Ambulatory Visit: Payer: Self-pay | Admitting: Nurse Practitioner

## 2016-02-29 ENCOUNTER — Encounter: Payer: Medicaid Other | Admitting: *Deleted

## 2016-03-02 ENCOUNTER — Telehealth: Payer: Self-pay | Admitting: Family Medicine

## 2016-03-03 MED ORDER — NICOTINE 14 MG/24HR TD PT24: 14.0000 mg | MEDICATED_PATCH | Freq: Every day | TRANSDERMAL | 2 refills | Status: DC

## 2016-03-03 NOTE — Telephone Encounter (Signed)
Aware, nicotine patch script sent to pharmacy.

## 2016-03-03 NOTE — Telephone Encounter (Signed)
Prescription sent to pharmacy. Unsure if insurance will pay.

## 2016-03-06 ENCOUNTER — Other Ambulatory Visit: Payer: Self-pay | Admitting: Family

## 2016-03-06 ENCOUNTER — Other Ambulatory Visit: Payer: Self-pay | Admitting: Family Medicine

## 2016-03-06 ENCOUNTER — Other Ambulatory Visit: Payer: Self-pay | Admitting: Nurse Practitioner

## 2016-03-06 DIAGNOSIS — Y95 Nosocomial condition: Principal | ICD-10-CM

## 2016-03-06 DIAGNOSIS — J189 Pneumonia, unspecified organism: Secondary | ICD-10-CM

## 2016-03-06 DIAGNOSIS — J439 Emphysema, unspecified: Secondary | ICD-10-CM

## 2016-03-06 DIAGNOSIS — K219 Gastro-esophageal reflux disease without esophagitis: Secondary | ICD-10-CM

## 2016-03-07 ENCOUNTER — Other Ambulatory Visit: Payer: Self-pay

## 2016-03-08 ENCOUNTER — Telehealth: Payer: Self-pay | Admitting: *Deleted
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
## 2016-03-13 ENCOUNTER — Ambulatory Visit (INDEPENDENT_AMBULATORY_CARE_PROVIDER_SITE_OTHER): Payer: Medicare Other

## 2016-03-13 ENCOUNTER — Encounter: Payer: Self-pay | Admitting: Pediatrics

## 2016-03-13 ENCOUNTER — Ambulatory Visit (INDEPENDENT_AMBULATORY_CARE_PROVIDER_SITE_OTHER): Payer: Medicare Other | Admitting: Pediatrics

## 2016-03-13 VITALS — BP 159/107 | HR 120 | Temp 97.0°F | Resp 22 | Ht 62.0 in | Wt 178.4 lb

## 2016-03-13 DIAGNOSIS — R0600 Dyspnea, unspecified: Secondary | ICD-10-CM | POA: Diagnosis not present

## 2016-03-13 DIAGNOSIS — J439 Emphysema, unspecified: Secondary | ICD-10-CM | POA: Diagnosis not present

## 2016-03-13 MED ORDER — PREDNISONE 20 MG PO TABS: ORAL_TABLET | ORAL | 0 refills | Status: DC

## 2016-03-13 MED ORDER — AZITHROMYCIN 250 MG PO TABS: ORAL_TABLET | ORAL | 0 refills | Status: DC

## 2016-03-13 MED ORDER — METHYLPREDNISOLONE ACETATE 80 MG/ML IJ SUSP
80.0000 mg | Freq: Once | INTRAMUSCULAR | Status: AC
  Administered 2016-03-13: 80 mg via INTRAMUSCULAR

## 2016-03-13 NOTE — Progress Notes (Signed)
  Subjective:   Patient ID: Latoya Bowman, female    DOB: 01/06/1961, 55 y.o.   MRN: [id number] CC: Shortness of Breath (10 days)  HPI: Latoya Bowman is a 55 y.o. female presenting for Shortness of Breath (10 days)  Coughing up more than usual Coughing up more sputum Now off white, normal color No fevers Feels SOB Using her inhalers and nebulizers which was helping with symptoms Nebulizer machine broke No chest pain Recently in the hospital one week ago for COPD exacerbation Left AMA after son commited suicide to take care of arrangements and family pets  Mood has been down, lots of other stressors ongoing past few months as well Says she feels safe at home now  Relevant past medical, surgical, family and social history reviewed. Allergies and medications reviewed and updated. History  Smoking Status  . Former Smoker  . Packs/day: 1.00  . Years: 40.00  . Start date: 05/08/1974  . Quit date: 08/07/2015  Smokeless Tobacco  . Never Used   ROS: Per HPI   Objective:    BP (!) 159/107   Pulse (!) 120   Temp 97 F (36.1 C) (Oral)   Resp (!) 22   Ht 5\' 2"  (1.575 m)   Wt 178 lb 6.4 oz (80.9 kg)   SpO2 98%   BMI 32.63 kg/m   Wt Readings from Last 3 Encounters:  03/13/16 178 lb 6.4 oz (80.9 kg)  02/09/16 168 lb 8 oz (76.4 kg)  01/21/16 170 lb (77.1 kg)    Gen: NAD, alert, cooperative with exam, NCAT, pt upset at times, tearful, coughing EYES: EOMI, no conjunctival injection, or no icterus ENT:  TMs pearly gray b/l, OP without erythema LYMPH: no cervical LAD CV: tachycardic, regular rhythm, normal S1/S2, no murmur Resp: moving air fair, prolonged exp phase, slightly tachypneic, speaking in complete sentences without shortness of breath Abd: +BS, soft, NTND. Ext: No edema, warm Neuro: Alert and oriented MSK: normal muscle bulk  Preliminary read by Rex Krasarol Malayja Freund, MD:  Blurred L heart border LLL, otherwise no opacities  Assessment & Plan:  Latoya Bowman was seen today for  shortness of breath.  Diagnoses and all orders for this visit:  Dyspnea, unspecified type -     DG Chest 2 View; Future -     methylPREDNISolone acetate (DEPO-MEDROL) injection 80 mg; Inject 1 mL (80 mg total) into the muscle once.  Pulmonary emphysema, unspecified emphysema type (HCC) Will replace neb machine Treat for COPD exacerbation as below Strict return precautions discussed Any worsening of breahting, needs to be seen -     DME Nebulizer machine -     predniSONE (DELTASONE) 20 MG tablet; 2 po at same time daily for 5 days -     azithromycin (ZITHROMAX) 250 MG tablet; Take 2 the first day and then one each day after.   Follow up plan: Return 1 week . Rex Krasarol Dhanvin Szeto, MD [facility]

## 2016-03-14 ENCOUNTER — Telehealth: Payer: Self-pay | Admitting: *Deleted

## 2016-03-14 MED ORDER — ALBUTEROL SULFATE (2.5 MG/3ML) 0.083% IN NEBU: INHALATION_SOLUTION | RESPIRATORY_TRACT | 1 refills | Status: DC

## 2016-03-14 NOTE — Telephone Encounter (Signed)
Resent correct rx.

## 2016-03-16 ENCOUNTER — Telehealth: Payer: Self-pay | Admitting: Family Medicine

## 2016-03-16 DIAGNOSIS — J4520 Mild intermittent asthma, uncomplicated: Secondary | ICD-10-CM

## 2016-03-16 NOTE — Telephone Encounter (Signed)
Printed form for neb machine available for patient.

## 2016-03-17 ENCOUNTER — Other Ambulatory Visit: Payer: Self-pay

## 2016-03-17 DIAGNOSIS — J45909 Unspecified asthma, uncomplicated: Secondary | ICD-10-CM | POA: Insufficient documentation

## 2016-03-17 NOTE — Telephone Encounter (Signed)
Patient aware that rx was faxed to [facility] and they will deliver Monday.

## 2016-03-17 NOTE — Telephone Encounter (Signed)
Patient called back states she does not want the neb machine since her insurance will not pay for it.

## 2016-03-17 NOTE — Telephone Encounter (Signed)
Latoya Bowman pharmacy called and insurance denied her neb machine since she has had a recent one filled. Latoya Bowman pharmacy will sell and still deliver it to her Monday if she can pay $50 cash at the time of deliver. Tried to contact patient, no answer, left message to call office back.

## 2016-03-27 ENCOUNTER — Ambulatory Visit: Payer: Medicare Other | Admitting: Family Medicine

## 2016-03-28 ENCOUNTER — Encounter: Payer: Self-pay | Admitting: Family Medicine

## 2016-03-29 ENCOUNTER — Encounter: Payer: Self-pay | Admitting: Family Medicine

## 2016-03-29 ENCOUNTER — Ambulatory Visit (INDEPENDENT_AMBULATORY_CARE_PROVIDER_SITE_OTHER): Payer: Medicare Other | Admitting: Family Medicine

## 2016-03-29 VITALS — BP 151/80 | HR 104 | Temp 97.4°F | Ht 62.0 in | Wt 184.2 lb

## 2016-03-29 DIAGNOSIS — M1711 Unilateral primary osteoarthritis, right knee: Secondary | ICD-10-CM

## 2016-03-29 DIAGNOSIS — I10 Essential (primary) hypertension: Secondary | ICD-10-CM | POA: Diagnosis not present

## 2016-03-29 DIAGNOSIS — F418 Other specified anxiety disorders: Secondary | ICD-10-CM | POA: Diagnosis not present

## 2016-03-29 DIAGNOSIS — D519 Vitamin B12 deficiency anemia, unspecified: Secondary | ICD-10-CM | POA: Diagnosis not present

## 2016-03-29 DIAGNOSIS — F419 Anxiety disorder, unspecified: Secondary | ICD-10-CM

## 2016-03-29 DIAGNOSIS — F329 Major depressive disorder, single episode, unspecified: Secondary | ICD-10-CM

## 2016-03-29 MED ORDER — OXYCODONE-ACETAMINOPHEN 5-325 MG PO TABS: 1.0000 | ORAL_TABLET | Freq: Two times a day (BID) | ORAL | 0 refills | Status: DC | PRN
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
## 2016-04-03 ENCOUNTER — Telehealth: Payer: Self-pay | Admitting: Family Medicine

## 2016-04-03 MED ORDER — AZITHROMYCIN 250 MG PO TABS: ORAL_TABLET | ORAL | 0 refills | Status: DC

## 2016-04-03 NOTE — Telephone Encounter (Signed)
We can send her a Z-Pak this one time but let her know that we typically do this because we like to make sure patient's aren't sick or with something like a pneumonia, that is why we like to examine her patients.

## 2016-04-03 NOTE — Telephone Encounter (Signed)
Patient states that she is coughing and SOB. She is unable to come in today or tomorrow due to her son's death. Please advise

## 2016-04-03 NOTE — Telephone Encounter (Signed)
Patient aware and rx sent to pharmacy for a zpak 

## 2016-04-05 ENCOUNTER — Ambulatory Visit: Payer: Medicare Other | Admitting: Family Medicine

## 2016-04-06 ENCOUNTER — Telehealth: Payer: Self-pay | Admitting: Family Medicine

## 2016-04-06 ENCOUNTER — Encounter: Payer: Self-pay | Admitting: Family Medicine

## 2016-04-07 ENCOUNTER — Other Ambulatory Visit: Payer: Self-pay | Admitting: Family Medicine

## 2016-04-17 ENCOUNTER — Other Ambulatory Visit: Payer: Self-pay | Admitting: Nurse Practitioner

## 2016-04-21 DIAGNOSIS — Z0289 Encounter for other administrative examinations: Secondary | ICD-10-CM

## 2016-04-26 ENCOUNTER — Ambulatory Visit: Payer: Medicare Other | Admitting: *Deleted

## 2016-04-28 ENCOUNTER — Ambulatory Visit (INDEPENDENT_AMBULATORY_CARE_PROVIDER_SITE_OTHER): Payer: Medicare Other | Admitting: *Deleted

## 2016-04-28 DIAGNOSIS — D518 Other vitamin B12 deficiency anemias: Secondary | ICD-10-CM

## 2016-04-28 DIAGNOSIS — D519 Vitamin B12 deficiency anemia, unspecified: Secondary | ICD-10-CM

## 2016-04-28 NOTE — Progress Notes (Signed)
Pt given Vit B12 inj Tolerated well 

## 2016-05-05 ENCOUNTER — Ambulatory Visit (INDEPENDENT_AMBULATORY_CARE_PROVIDER_SITE_OTHER): Payer: Medicare Other | Admitting: Family Medicine

## 2016-05-05 ENCOUNTER — Telehealth: Payer: Self-pay | Admitting: *Deleted

## 2016-05-05 ENCOUNTER — Encounter: Payer: Self-pay | Admitting: Family Medicine

## 2016-05-05 VITALS — BP 177/104 | HR 101 | Temp 96.9°F | Ht 62.0 in | Wt 181.2 lb

## 2016-05-05 DIAGNOSIS — I10 Essential (primary) hypertension: Secondary | ICD-10-CM

## 2016-05-05 DIAGNOSIS — Z1159 Encounter for screening for other viral diseases: Secondary | ICD-10-CM | POA: Diagnosis not present

## 2016-05-05 DIAGNOSIS — F32A Depression, unspecified: Secondary | ICD-10-CM

## 2016-05-05 DIAGNOSIS — R102 Pelvic and perineal pain: Secondary | ICD-10-CM

## 2016-05-05 DIAGNOSIS — F418 Other specified anxiety disorders: Secondary | ICD-10-CM

## 2016-05-05 DIAGNOSIS — F329 Major depressive disorder, single episode, unspecified: Secondary | ICD-10-CM

## 2016-05-05 DIAGNOSIS — Z114 Encounter for screening for human immunodeficiency virus [HIV]: Secondary | ICD-10-CM | POA: Diagnosis not present

## 2016-05-05 DIAGNOSIS — E785 Hyperlipidemia, unspecified: Secondary | ICD-10-CM

## 2016-05-05 DIAGNOSIS — H60332 Swimmer's ear, left ear: Secondary | ICD-10-CM

## 2016-05-05 DIAGNOSIS — F419 Anxiety disorder, unspecified: Secondary | ICD-10-CM

## 2016-05-05 MED ORDER — LISINOPRIL 20 MG PO TABS: 20.0000 mg | ORAL_TABLET | Freq: Every day | ORAL | 3 refills | Status: AC

## 2016-05-05 MED ORDER — CIPROFLOXACIN-HYDROCORTISONE 0.2-1 % OT SUSP: 3.0000 [drp] | Freq: Two times a day (BID) | OTIC | 0 refills | Status: DC

## 2016-05-05 MED ORDER — HYDROCHLOROTHIAZIDE 25 MG PO TABS: 25.0000 mg | ORAL_TABLET | Freq: Every day | ORAL | 3 refills | Status: DC

## 2016-05-05 NOTE — Progress Notes (Signed)
BP (!) 177/104   Pulse (!) 101   Temp (!) 96.9 F (36.1 C) (Oral)   Ht 5' 2"  (1.575 m)   Wt 181 lb 4 oz (82.2 kg)   BMI 33.15 kg/m    Subjective:    Patient ID: Latoya Bowman, female    DOB: 11/26/1960, 55 y.o.   MRN: [id number]  HPI: Latoya Bowman is a 55 y.o. female presenting on 05/05/2016 for Chest congestion and cough (x 4 days; has not taken any medication); Left ear pain; and Hypertension   HPI Hypertension recheck Patient comes in today for a blood pressure recheck. Her blood pressure today is 177/104. She has been on lisinopril and says she is taking it every day and says that she has taken it today. She does not know why her blood pressures also send shooting up as it has been under control previously. Patient denies headaches, blurred vision, chest pains, shortness of breath, or weakness. Denies any side effects from medication and is content with current medication.   Ear pain, left Patient has having left ear pain that has been going on for the past couple weeks. She does not know what has been going on but she has had chest congestion and coughing for 4 days which comes and goes intermittently. She denies any fevers or chills or shortness of breath. She chronically has wheezing all of the time but has not noticed that it is worse than it normally is. She has not used anything for years yet.  Anxiety and depression Patient is on multiple different agents for anxiety and depression and we had discussed last time possibly getting her to go see a psychiatrist for more per size management of this issue, she says that she would like to go ahead and do the referral today. She denies any suicidal ideations or thoughts of hurting herself. She does feel depressed and anxious quite frequently which is very much associated with her healthcare condition.  Right lower pelvic pain Patient is having right lower pelvic pain and she has been told previously that she has cysts on her ovaries  and she would like a referral to OB/GYN.  Relevant past medical, surgical, family and social history reviewed and updated as indicated. Interim medical history since our last visit reviewed. Allergies and medications reviewed and updated.  Review of Systems  Constitutional: Negative for chills and fever.  HENT: Positive for ear discharge, ear pain and sinus pressure. Negative for congestion, sneezing, sore throat and voice change.   Eyes: Negative for pain, redness and visual disturbance.  Respiratory: Negative for chest tightness and shortness of breath.   Cardiovascular: Negative for chest pain and leg swelling.  Gastrointestinal: Negative for abdominal pain.  Genitourinary: Positive for pelvic pain. Negative for difficulty urinating, dysuria, flank pain, genital sores, hematuria, menstrual problem, urgency, vaginal bleeding, vaginal discharge and vaginal pain.  Musculoskeletal: Negative for back pain and gait problem.  Skin: Negative for rash.  Neurological: Negative for dizziness, light-headedness and headaches.  Psychiatric/Behavioral: Positive for dysphoric mood. Negative for agitation, behavioral problems, self-injury, sleep disturbance and suicidal ideas. The patient is nervous/anxious.   All other systems reviewed and are negative.   Per HPI unless specifically indicated above     Objective:    BP (!) 177/104   Pulse (!) 101   Temp (!) 96.9 F (36.1 C) (Oral)   Ht 5' 2"  (1.575 m)   Wt 181 lb 4 oz (82.2 kg)   BMI 33.15 kg/m  Wt Readings from Last 3 Encounters:  05/05/16 181 lb 4 oz (82.2 kg)  03/29/16 184 lb 3.2 oz (83.6 kg)  03/13/16 178 lb 6.4 oz (80.9 kg)    Physical Exam  Constitutional: She is oriented to person, place, and time. She appears well-developed and well-nourished. No distress.  HENT:  Right Ear: No drainage, swelling or tenderness. Tympanic membrane is not injected and not scarred.  Left Ear: There is drainage, swelling and tenderness. Tympanic  membrane is not injected and not scarred.  Mouth/Throat: Oropharynx is clear and moist. No oropharyngeal exudate.  Eyes: Conjunctivae are normal.  Cardiovascular: Normal rate, regular rhythm, normal heart sounds and intact distal pulses.   No murmur heard. Pulmonary/Chest: Effort normal. No respiratory distress. She has wheezes (No change from previous).  Abdominal: Soft. Normal appearance and bowel sounds are normal. There is tenderness (Tenderness very low in the suprapubic/pelvic region).  Musculoskeletal: Normal range of motion. She exhibits no edema or tenderness.  Neurological: She is alert and oriented to person, place, and time. Coordination normal.  Skin: Skin is warm and dry. No rash noted. She is not diaphoretic.  Psychiatric: Her behavior is normal. Thought content normal. Her mood appears anxious. She exhibits a depressed mood. She expresses no suicidal ideation. She expresses no suicidal plans.  Nursing note and vitals reviewed.   No results found for this or any previous visit.    Assessment & Plan:   Problem List Items Addressed This Visit      Cardiovascular and Mediastinum   Essential hypertension, benign - Primary   Relevant Medications   hydrochlorothiazide (HYDRODIURIL) 25 MG tablet   Other Relevant Orders   CMP14+EGFR     Other   Hyperlipidemia LDL goal <130   Relevant Medications   hydrochlorothiazide (HYDRODIURIL) 25 MG tablet   Other Relevant Orders   Lipid panel   Anxiety and depression    Needs referral to psychiatry      Relevant Orders   Ambulatory referral to Psychiatry    Other Visit Diagnoses    Pelvic pain       Patient is having pelvic pain and dyspareunia and wants to see an obstetrician   Relevant Orders   Ambulatory referral to Obstetrics / Gynecology   Acute swimmer's ear of left side       Relevant Medications   ciprofloxacin-hydrocortisone (CIPRO HC) otic suspension   Screening for HIV without presence of risk factors        Relevant Orders   HIV antibody   Need for hepatitis C screening test       Relevant Orders   Hepatitis C antibody       Follow up plan: Return in about 3 months (around 08/03/2016), or if symptoms worsen or fail to improve, for Recheck breathing and hypertension.  Counseling provided for all of the vaccine components Orders Placed This Encounter  Procedures  . CMP14+EGFR  . Lipid panel  . HIV antibody  . Hepatitis C antibody  . Ambulatory referral to Psychiatry  . Ambulatory referral to Obstetrics / Gynecology    Caryl Pina, MD [facility] 05/05/2016, 2:33 PM

## 2016-05-05 NOTE — Assessment & Plan Note (Signed)
Needs referral to psychiatry

## 2016-05-05 NOTE — Telephone Encounter (Signed)
Pharmacist says HCTZ has a cross sensitivity with sulfa. Pt had an anaphylactic reaction to sulfa. Please advise and have nurse call [facility] back

## 2016-05-05 NOTE — Telephone Encounter (Signed)
Pharmacy aware. Left message for patient to call back about medication change.

## 2016-05-06 LAB — CMP14+EGFR
ALK PHOS: 93 IU/L (ref 39–117)
ALT: 14 IU/L (ref 0–32)
AST: 16 IU/L (ref 0–40)
Albumin/Globulin Ratio: 1.4 (ref 1.2–2.2)
Albumin: 4.2 g/dL (ref 3.5–5.5)
BUN/Creatinine Ratio: 13 (ref 9–23)
BUN: 9 mg/dL (ref 6–24)
CHLORIDE: 100 mmol/L (ref 96–106)
CO2: 26 mmol/L (ref 18–29)
CREATININE: 0.71 mg/dL (ref 0.57–1.00)
Calcium: 9.3 mg/dL (ref 8.7–10.2)
GFR calc Af Amer: 112 mL/min/{1.73_m2} (ref >59)
GFR calc non Af Amer: 97 mL/min/{1.73_m2} (ref >59)
Globulin, Total: 3.1 g/dL (ref 1.5–4.5)
Glucose: 99 mg/dL (ref 65–99)
Potassium: 4.4 mmol/L (ref 3.5–5.2)
Sodium: 143 mmol/L (ref 134–144)
Total Protein: 7.3 g/dL (ref 6.0–8.5)

## 2016-05-06 LAB — LIPID PANEL
CHOLESTEROL TOTAL: 276 mg/dL — AB (ref 100–199)
Chol/HDL Ratio: 5.5 ratio units — ABNORMAL HIGH (ref 0.0–4.4)
HDL: 50 mg/dL (ref >39)
TRIGLYCERIDES: 478 mg/dL — AB (ref 0–149)

## 2016-05-06 LAB — HEPATITIS C ANTIBODY: Hep C Virus Ab: <0.1 s/co ratio (ref 0.0–0.9)

## 2016-05-06 LAB — HIV ANTIBODY (ROUTINE TESTING W REFLEX): HIV Screen 4th Generation wRfx: NONREACTIVE

## 2016-05-10 ENCOUNTER — Telehealth: Payer: Self-pay | Admitting: Family Medicine

## 2016-05-10 MED ORDER — LOVASTATIN 40 MG PO TABS: 40.0000 mg | ORAL_TABLET | Freq: Every day | ORAL | 5 refills | Status: DC

## 2016-05-10 NOTE — Telephone Encounter (Signed)
Pt notified of results Verbalizes understanding RX sent into pharmacy per pt request

## 2016-05-15 NOTE — Progress Notes (Deleted)
   Subjective:    Patient ID: Latoya Bowman, female    DOB: [birth year], 56 y.o.   MRN: [id number]  HPI    Review of Systems     Objective:   Physical Exam        Assessment & Plan:

## 2016-05-16 ENCOUNTER — Ambulatory Visit: Payer: Medicare Other | Admitting: Family Medicine

## 2016-05-18 ENCOUNTER — Encounter (INDEPENDENT_AMBULATORY_CARE_PROVIDER_SITE_OTHER): Payer: Self-pay

## 2016-05-18 ENCOUNTER — Encounter: Payer: Self-pay | Admitting: Family Medicine

## 2016-05-18 ENCOUNTER — Ambulatory Visit (INDEPENDENT_AMBULATORY_CARE_PROVIDER_SITE_OTHER): Payer: Medicare Other | Admitting: Family Medicine

## 2016-05-18 VITALS — BP 138/87 | HR 112 | Temp 98.3°F | Ht 62.0 in | Wt 180.4 lb

## 2016-05-18 DIAGNOSIS — J302 Other seasonal allergic rhinitis: Secondary | ICD-10-CM | POA: Diagnosis not present

## 2016-05-18 DIAGNOSIS — M1712 Unilateral primary osteoarthritis, left knee: Secondary | ICD-10-CM | POA: Diagnosis not present

## 2016-05-18 DIAGNOSIS — M1711 Unilateral primary osteoarthritis, right knee: Secondary | ICD-10-CM

## 2016-05-18 MED ORDER — OXYCODONE-ACETAMINOPHEN 5-325 MG PO TABS: 1.0000 | ORAL_TABLET | Freq: Two times a day (BID) | ORAL | 0 refills | Status: DC | PRN

## 2016-05-18 NOTE — Progress Notes (Signed)
BP (!) 146/95   Pulse (!) 112   Temp 98.3 F (36.8 C) (Oral)   Ht 5\' 2"  (1.575 m)   Wt 180 lb 6 oz (81.8 kg)   BMI 32.99 kg/m    Subjective:    Patient ID: Latoya Bowman, female    DOB: Aug 07, 1960, 56 y.o.   MRN: [id number]  HPI: Latoya Bowman is a 56 y.o. female presenting on 05/18/2016 for Cough, hoarseness, sore throat (x 3-4 days) and Orthopedic referral   HPI Patient has been having more significant pain and osteoarthritis in both of her knees and would like a new referral to an orthopedic. This is been a chronic issue for her and she is taking some pain medication for this.  Cough and congestion and sinus drainage Patient is coming in today for cough and congestion and sinus drainage that is been going on for a few weeks and worsened over the past 3-4 days. She did notice a big part of her cat yesterday and all of her symptoms have resolved today. She denies any shortness of breath or wheezing or fevers or chills more than her baseline. Her cough is all but resolved.  Relevant past medical, surgical, family and social history reviewed and updated as indicated. Interim medical history since our last visit reviewed. Allergies and medications reviewed and updated.  Review of Systems  Constitutional: Negative for chills and fever.  HENT: Positive for congestion, postnasal drip, rhinorrhea, sinus pressure, sneezing and sore throat. Negative for ear discharge and ear pain.   Eyes: Negative for pain, redness and visual disturbance.  Respiratory: Positive for cough. Negative for chest tightness, shortness of breath and wheezing.   Cardiovascular: Negative for chest pain and leg swelling.  Genitourinary: Negative for difficulty urinating and dysuria.  Musculoskeletal: Positive for arthralgias. Negative for back pain, gait problem and joint swelling.  Skin: Negative for rash.  Neurological: Negative for light-headedness and headaches.  Psychiatric/Behavioral: Negative for agitation  and behavioral problems.  All other systems reviewed and are negative.   Per HPI unless specifically indicated above      Objective:    BP (!) 146/95   Pulse (!) 112   Temp 98.3 F (36.8 C) (Oral)   Ht 5\' 2"  (1.575 m)   Wt 180 lb 6 oz (81.8 kg)   BMI 32.99 kg/m   Wt Readings from Last 3 Encounters:  05/18/16 180 lb 6 oz (81.8 kg)  05/05/16 181 lb 4 oz (82.2 kg)  03/29/16 184 lb 3.2 oz (83.6 kg)    Physical Exam  Constitutional: She is oriented to person, place, and time. She appears well-developed and well-nourished. No distress.  HENT:  Right Ear: Tympanic membrane, external ear and ear canal normal.  Left Ear: Tympanic membrane, external ear and ear canal normal.  Nose: No mucosal edema or rhinorrhea. No epistaxis. Right sinus exhibits no maxillary sinus tenderness and no frontal sinus tenderness. Left sinus exhibits no maxillary sinus tenderness and no frontal sinus tenderness.  Mouth/Throat: Uvula is midline and mucous membranes are normal. No oropharyngeal exudate, posterior oropharyngeal edema, posterior oropharyngeal erythema or tonsillar abscesses.  Eyes: Conjunctivae and EOM are normal.  Cardiovascular: Normal rate, regular rhythm, normal heart sounds and intact distal pulses.   No murmur heard. Pulmonary/Chest: Effort normal and breath sounds normal. No respiratory distress. She has no wheezes.  Musculoskeletal: Normal range of motion. She exhibits no edema or tenderness.  Neurological: She is alert and oriented to person, place, and time. Coordination normal.  Skin: Skin is warm and dry. No rash noted. She is not diaphoretic.  Psychiatric: She has a normal mood and affect. Her behavior is normal.  Vitals reviewed.     Assessment & Plan:   Problem List Items Addressed This Visit      Musculoskeletal and Integument   Osteoarthritis of left knee   Relevant Medications   oxyCODONE-acetaminophen (ROXICET) 5-325 MG tablet   oxyCODONE-acetaminophen (ROXICET) 5-325  MG tablet   Osteoarthritis of right knee   Relevant Medications   oxyCODONE-acetaminophen (ROXICET) 5-325 MG tablet   oxyCODONE-acetaminophen (ROXICET) 5-325 MG tablet    Other Visit Diagnoses    Acute seasonal allergic rhinitis, unspecified trigger    -  Primary   She was having a lot of cough and sinus and drainage but they got rid of the cat yesterday and she has been completely better today.       Follow up plan: Return if symptoms worsen or fail to improve.  Counseling provided for all of the vaccine components No orders of the defined types were placed in this encounter.   Arville CareJoshua Jatin Naumann, MD [facility] 05/18/2016, 3:28 PM

## 2016-05-22 ENCOUNTER — Other Ambulatory Visit: Payer: Self-pay | Admitting: Family Medicine

## 2016-05-22 DIAGNOSIS — J439 Emphysema, unspecified: Secondary | ICD-10-CM

## 2016-05-23 ENCOUNTER — Telehealth (HOSPITAL_COMMUNITY): Payer: Self-pay | Admitting: *Deleted

## 2016-05-23 ENCOUNTER — Encounter: Payer: Medicare Other | Admitting: Obstetrics & Gynecology

## 2016-05-23 NOTE — Telephone Encounter (Signed)
LEFT VOICE MESSAGE REGARDING AN APPOINTMENT. 

## 2016-05-26 ENCOUNTER — Other Ambulatory Visit: Payer: Self-pay | Admitting: Family Medicine

## 2016-05-26 DIAGNOSIS — H60332 Swimmer's ear, left ear: Secondary | ICD-10-CM

## 2016-05-29 ENCOUNTER — Encounter: Payer: Medicare Other | Admitting: Women's Health

## 2016-06-02 ENCOUNTER — Ambulatory Visit (INDEPENDENT_AMBULATORY_CARE_PROVIDER_SITE_OTHER): Payer: Medicare Other | Admitting: *Deleted

## 2016-06-02 DIAGNOSIS — D519 Vitamin B12 deficiency anemia, unspecified: Secondary | ICD-10-CM | POA: Diagnosis not present

## 2016-06-02 DIAGNOSIS — D518 Other vitamin B12 deficiency anemias: Secondary | ICD-10-CM

## 2016-06-02 NOTE — Progress Notes (Signed)
Pt given Vit B12 inj Tolerated well 

## 2016-06-19 ENCOUNTER — Other Ambulatory Visit: Payer: Self-pay

## 2016-06-19 ENCOUNTER — Telehealth: Payer: Self-pay | Admitting: Family Medicine

## 2016-06-19 MED ORDER — ALBUTEROL SULFATE (5 MG/ML) 0.5% IN NEBU: 2.5000 mg | INHALATION_SOLUTION | Freq: Four times a day (QID) | RESPIRATORY_TRACT | 12 refills | Status: AC | PRN

## 2016-06-19 NOTE — Telephone Encounter (Signed)
I thought that's what was sent, I guess go ahead and send a new prescription and change it as needed.

## 2016-06-23 ENCOUNTER — Other Ambulatory Visit: Payer: Self-pay | Admitting: Family Medicine

## 2016-06-23 DIAGNOSIS — J439 Emphysema, unspecified: Secondary | ICD-10-CM

## 2016-07-08 ENCOUNTER — Other Ambulatory Visit: Payer: Self-pay | Admitting: Family Medicine

## 2016-07-16 ENCOUNTER — Other Ambulatory Visit: Payer: Self-pay | Admitting: Family Medicine

## 2016-07-26 ENCOUNTER — Ambulatory Visit (HOSPITAL_COMMUNITY): Payer: Self-pay | Admitting: Psychiatry

## 2016-07-26 ENCOUNTER — Ambulatory Visit (HOSPITAL_COMMUNITY): Payer: Medicare Other | Admitting: Psychiatry

## 2016-08-10 ENCOUNTER — Other Ambulatory Visit: Payer: Self-pay | Admitting: Family Medicine

## 2016-08-10 DIAGNOSIS — J189 Pneumonia, unspecified organism: Secondary | ICD-10-CM

## 2016-08-10 DIAGNOSIS — J439 Emphysema, unspecified: Secondary | ICD-10-CM

## 2016-08-10 DIAGNOSIS — Y95 Nosocomial condition: Principal | ICD-10-CM

## 2016-08-11 ENCOUNTER — Other Ambulatory Visit: Payer: Self-pay | Admitting: Family Medicine

## 2016-09-28 ENCOUNTER — Telehealth: Payer: Self-pay | Admitting: Family Medicine

## 2016-10-04 ENCOUNTER — Other Ambulatory Visit: Payer: Self-pay | Admitting: Family Medicine

## 2016-10-04 DIAGNOSIS — F419 Anxiety disorder, unspecified: Principal | ICD-10-CM

## 2016-10-04 DIAGNOSIS — F32A Depression, unspecified: Secondary | ICD-10-CM

## 2016-10-04 DIAGNOSIS — F329 Major depressive disorder, single episode, unspecified: Secondary | ICD-10-CM

## 2016-11-01 ENCOUNTER — Other Ambulatory Visit: Payer: Self-pay | Admitting: Family Medicine

## 2016-11-01 DIAGNOSIS — J189 Pneumonia, unspecified organism: Secondary | ICD-10-CM

## 2016-11-01 DIAGNOSIS — J439 Emphysema, unspecified: Secondary | ICD-10-CM

## 2016-11-01 DIAGNOSIS — Y95 Nosocomial condition: Secondary | ICD-10-CM

## 2016-12-05 ENCOUNTER — Other Ambulatory Visit: Payer: Self-pay | Admitting: Family Medicine

## 2016-12-05 ENCOUNTER — Ambulatory Visit (INDEPENDENT_AMBULATORY_CARE_PROVIDER_SITE_OTHER): Payer: Medicare Other | Admitting: Family Medicine

## 2016-12-05 DIAGNOSIS — J441 Chronic obstructive pulmonary disease with (acute) exacerbation: Secondary | ICD-10-CM | POA: Diagnosis not present

## 2016-12-05 DIAGNOSIS — J440 Chronic obstructive pulmonary disease with acute lower respiratory infection: Secondary | ICD-10-CM | POA: Diagnosis not present

## 2016-12-05 DIAGNOSIS — J15212 Pneumonia due to Methicillin resistant Staphylococcus aureus: Secondary | ICD-10-CM | POA: Diagnosis not present

## 2016-12-05 DIAGNOSIS — G40909 Epilepsy, unspecified, not intractable, without status epilepticus: Secondary | ICD-10-CM

## 2016-12-06 NOTE — Telephone Encounter (Signed)
Please let the patient know that we will send in the refill, she was recently in the hospital for possible recurrent seizures, so she needs to go see a neurologist, if she does not have one go ahead and put in a referral for her. They need to look closer at this and manage this for her because of her recurrent seizures.

## 2016-12-11 ENCOUNTER — Other Ambulatory Visit: Payer: Self-pay | Admitting: Family Medicine

## 2016-12-12 ENCOUNTER — Other Ambulatory Visit: Payer: Self-pay | Admitting: Family Medicine

## 2016-12-12 DIAGNOSIS — J439 Emphysema, unspecified: Secondary | ICD-10-CM

## 2016-12-12 NOTE — Telephone Encounter (Signed)
Last seen 05/18/16  Dr Dettinger

## 2016-12-13 ENCOUNTER — Other Ambulatory Visit: Payer: Self-pay | Admitting: Family Medicine

## 2016-12-14 NOTE — Telephone Encounter (Signed)
laSt seen 05/18/16  Dr Dettinger

## 2017-01-09 ENCOUNTER — Other Ambulatory Visit: Payer: Self-pay | Admitting: Family Medicine

## 2017-01-09 DIAGNOSIS — J439 Emphysema, unspecified: Secondary | ICD-10-CM

## 2017-01-31 ENCOUNTER — Other Ambulatory Visit: Payer: Self-pay | Admitting: Family Medicine

## 2017-02-07 ENCOUNTER — Other Ambulatory Visit: Payer: Self-pay | Admitting: Family Medicine

## 2017-02-07 DIAGNOSIS — J439 Emphysema, unspecified: Secondary | ICD-10-CM

## 2017-02-07 NOTE — Telephone Encounter (Signed)
last seen 05/18/16  Dr Dettinger

## 2017-02-12 ENCOUNTER — Other Ambulatory Visit: Payer: Self-pay | Admitting: Family Medicine

## 2017-02-12 NOTE — Telephone Encounter (Signed)
Last seen 05/18/16  Dr Dettinger 

## 2017-03-10 ENCOUNTER — Other Ambulatory Visit: Payer: Self-pay | Admitting: Family Medicine

## 2017-03-10 DIAGNOSIS — J439 Emphysema, unspecified: Secondary | ICD-10-CM

## 2017-06-03 ENCOUNTER — Other Ambulatory Visit: Payer: Self-pay | Admitting: Family Medicine

## 2017-07-10 IMAGING — DX DG CHEST 2V
2 series · 2 of 2 positions shown · non-contrast
Comparison: None.

CLINICAL DATA: Shortness of breath. History of asthma and
congestive heart failure.

EXAM:
CHEST  2 VIEW

[chest pa]
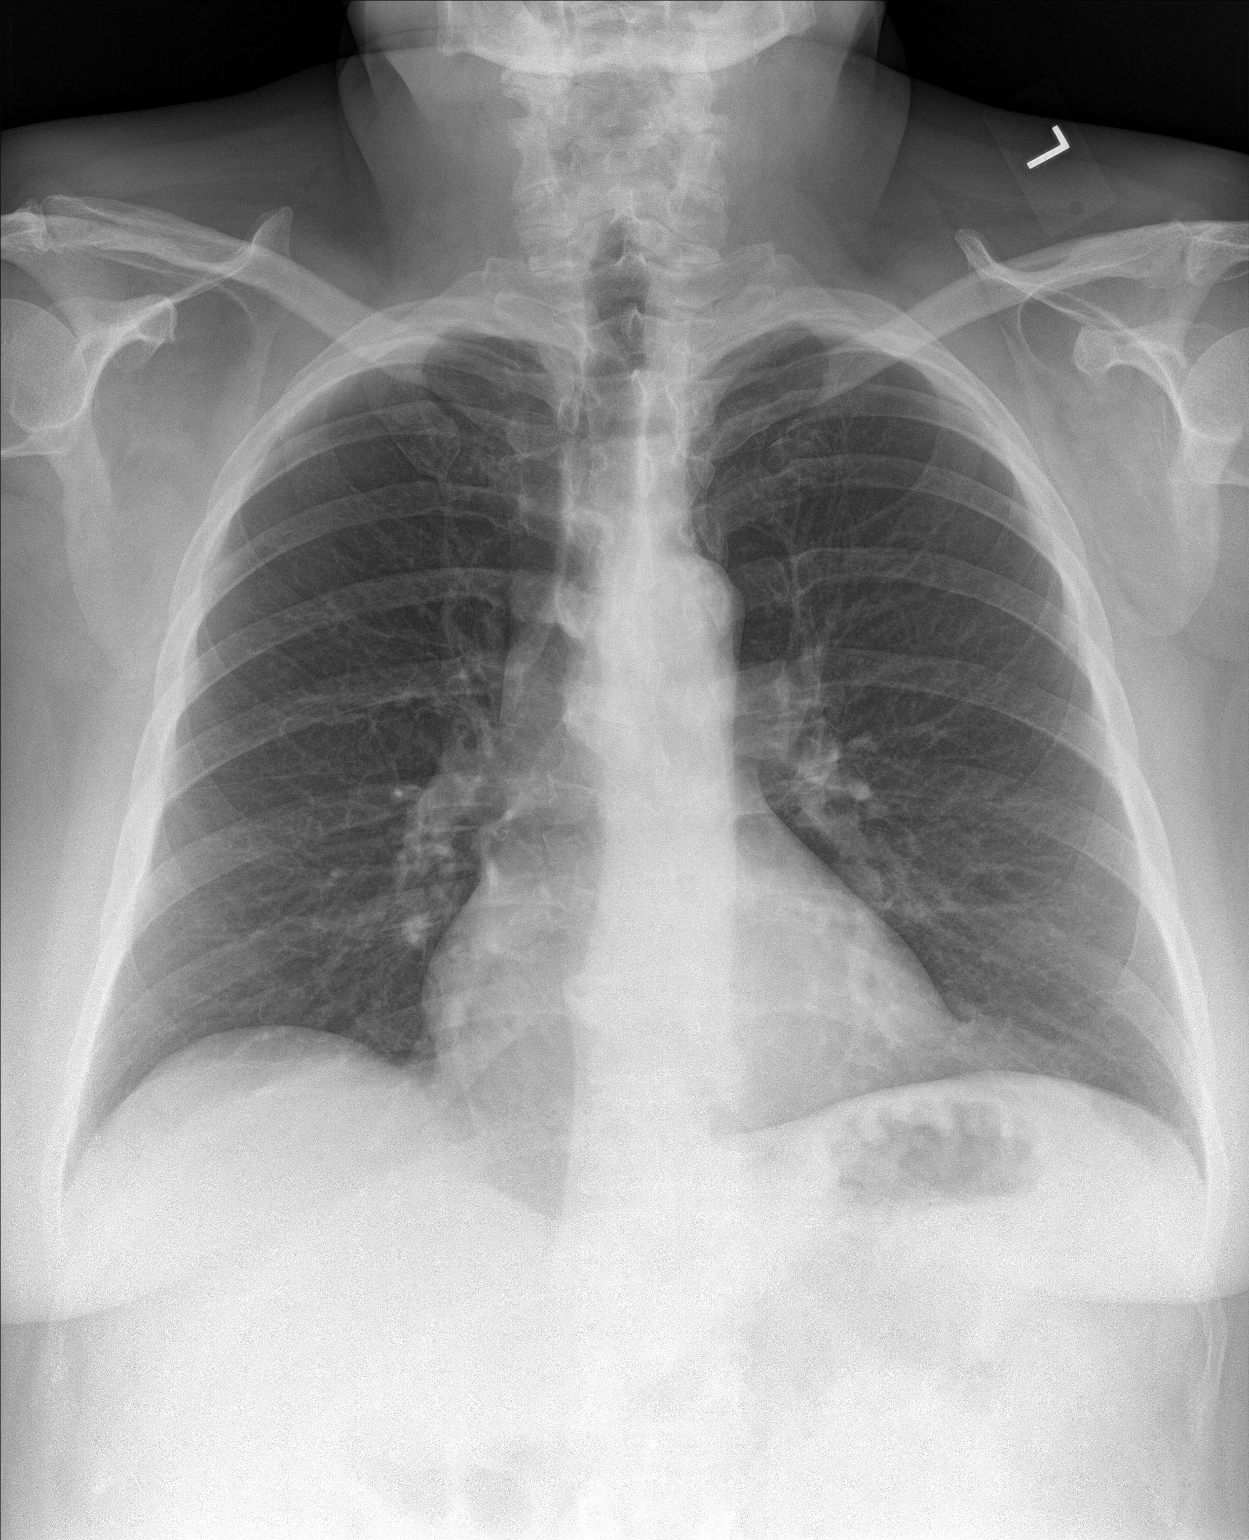

[chest lat]
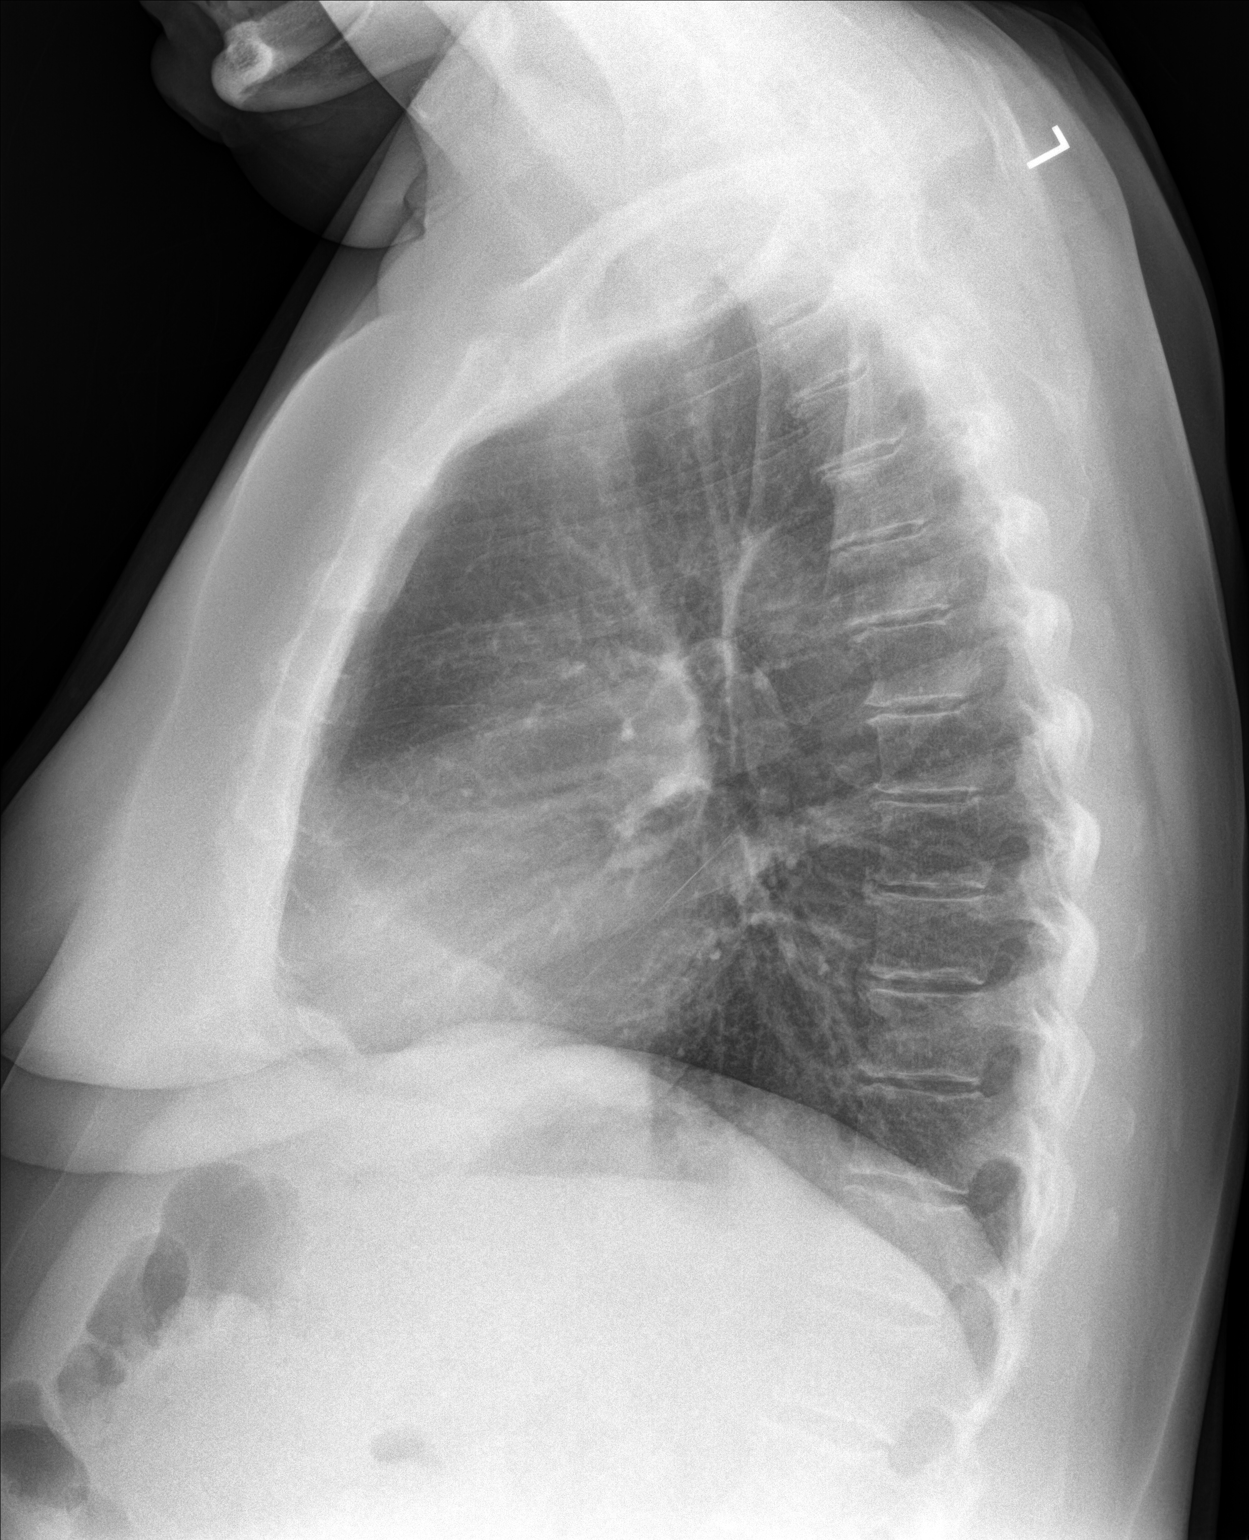

[2 of 2 positions shown; findings below may reference images not displayed]

FINDINGS: Heart size is at the upper limits of normal. Mediastinal shadows are
normal. The lungs are clear. The vascularity is normal. No
effusions. Ordinary minimal degenerative changes affect the spine.
IMPRESSION: No active cardiopulmonary disease.

## 2017-09-25 ENCOUNTER — Telehealth: Payer: Self-pay | Admitting: Family Medicine

## 2018-09-16 ENCOUNTER — Telehealth: Payer: Self-pay | Admitting: Family Medicine

## 2018-09-18 ENCOUNTER — Other Ambulatory Visit: Payer: Self-pay | Admitting: *Deleted
